# Patient Record
Sex: Male | Born: 1975 | Race: Black or African American | Hispanic: No | Smoking: Never smoker
Health system: Southern US, Community
[De-identification: ages and names within clinical notes are randomized; demographics above are authoritative.]

## PROBLEM LIST (undated history)

## (undated) DIAGNOSIS — F32A Depression, unspecified: Secondary | ICD-10-CM

## (undated) DIAGNOSIS — K219 Gastro-esophageal reflux disease without esophagitis: Secondary | ICD-10-CM

## (undated) DIAGNOSIS — I10 Essential (primary) hypertension: Secondary | ICD-10-CM

## (undated) HISTORY — DX: Gastro-esophageal reflux disease without esophagitis: K21.9

## (undated) HISTORY — DX: Essential (primary) hypertension: I10

## (undated) HISTORY — DX: Depression, unspecified: F32.A

---

## 2017-06-16 DIAGNOSIS — Z139 Encounter for screening, unspecified: Secondary | ICD-10-CM

## 2017-06-16 LAB — GLUCOSE, POCT (MANUAL RESULT ENTRY): POC Glucose: 99 mg/dl (ref 70–99)

## 2017-06-21 NOTE — Congregational Nurse Program (Signed)
Congregational Nurse Program Note  Date of Encounter: 06/16/2017  Past Medical History: No past medical history on file.  Encounter Details:     CNP Questionnaire - 06/16/17 1628      Patient Demographics   Is this a new or existing patient? New   Patient is considered a/an Not Applicable   Race Bi-Racial/Multi-Racial     Patient Assistance   Location of Patient Assistance [facility]   Patient's financial/insurance status Low Income;Self-Pay (Uninsured)   Uninsured Patient ([place] Card/Care Connects) Yes   Interventions Counseled to make appt. with provider;Assisted patient in making appt.   Patient referred to apply for the following financial assistance Not Applicable   Food insecurities addressed Not Applicable   Transportation assistance No   Assistance securing medications No   Educational health offerings Behavioral health;Chronic disease;Navigating the healthcare system     Encounter Details   Primary purpose of visit Other   Was an Emergency Department visit averted? Not Applicable   Does patient have a medical provider? Yes  but wants to transfer   Patient referred to Doctor referral for a non-emergent behavioral health crisis   Was a mental health screening completed? (GAINS tool) Yes   Was a mental health referral made? Yes   Does patient have dental issues? No   Does patient have vision issues? No   Does your patient have an abnormal blood pressure today? Yes   Since previous encounter, have you referred patient for abnormal blood pressure that resulted in a new diagnosis or medication change? No   Does your patient have an abnormal blood glucose today? No   Since previous encounter, have you referred patient for abnormal blood glucose that resulted in a new diagnosis or medication change? No   Was there a life-saving intervention made? No     Client was referred to Child psychotherapistsocial worker from [facility], Norval GablePatricia Gilley, at [facility] for a  behavioral health assessment. Client answered questions appropriately. Client appeared nervous throughout the session.  Social worker administered the GAIN-SS with client and the client scored a 7 for the past 90 days. Client denied both suicidal and homicidal ideations. Client denied both auditory and visual hallucinations and any current substance use. Client stated that the last time he thought about killing himself was over a year ago when his mother passed away. He denied having a plan at the time and his anchor was his fiancee.   Client reports feeling depressed often and anxious around others. Client stated he is quick to anger. His fiancee is his primary source of emotional support.    Social worker discussed [place]. Client decided he would like to go [facility] during their walk-in hours. Social worker will follow up with client to ensure his connection to services.   Irwin BrakemanAmber Maham Quintin, MSW, [phone]

## 2017-06-21 NOTE — Congregational Nurse Program (Signed)
Congregational Nurse Program Note  Date of Encounter: 06/16/2017  Past Medical History: No past medical history on file.  Encounter Details:     CNP Questionnaire - 06/16/17 1550      Patient Demographics   Is this a new or existing patient? New   Patient is considered a/an Not Applicable   Race Bi-Racial/Multi-Racial     Patient Assistance   Location of Patient Assistance [facility]   Patient's financial/insurance status Low Income;Self-Pay (Uninsured)   Uninsured Patient ([place] Card/Care Connects) Yes   Interventions Counseled to make appt. with provider;Assisted patient in making appt.   Patient referred to apply for the following financial assistance Not Applicable   Food insecurities addressed Not Applicable   Transportation assistance No   Assistance securing medications No   Educational health offerings Behavioral health;Chronic disease;Navigating the healthcare system     Encounter Details   Primary purpose of visit Chronic Illness/Condition Visit   Was an Emergency Department visit averted? Not Applicable   Does patient have a medical provider? Yes  but wants to transfer   Patient referred to Area Agency;Clinic;Other;Establish PCP   Was a mental health screening completed? (GAINS tool) Yes   Was a mental health referral made? Yes   Does patient have dental issues? No   Does patient have vision issues? No   Does your patient have an abnormal blood pressure today? Yes   Since previous encounter, have you referred patient for abnormal blood pressure that resulted in a new diagnosis or medication change? No   Does your patient have an abnormal blood glucose today? No   Since previous encounter, have you referred patient for abnormal blood glucose that resulted in a new diagnosis or medication change? No   Was there a life-saving intervention made? No     New client to [facility]. Client lives with his girlfriend and both are applying for disability. 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 Client has previously been seen at [facility] and treated for hypertension. Client states he wants to transfer stating he doesn't feel they addressed his back pain while there.  No Past surgical history  Medical History Hypertension  No Known drug allergies  Current medications per client: Lisinopril 20 mg one tablet orally once a day. ( client reports taking it at night )  Alert and oriented to person place and time. Answers questions appropriately. Does not appear anxious. Last seen per client at [facility] was 6 months ago. States they told him they could "only see me for my blood pressure". Client reports long history of lower back pain in the lower thoracic to upper lumbar region, it only radiates to the sides. He does not report any numbness of his legs or weakness, gait normal. Complains that it hurts to stand or sit and the pain with standing at his last job was too painful. Client reports depression and anxiety around people. He also reports that "I have anger issues". Client asked if seeking counseling would be helpful and he is agreeable. Referral made to [employer] MSW while client is here today. Client also complains of constipation and RN discussed increasing fiber and water into diet. Client states he gets vegetables and fruits in smoothies and we discussed eating fresh raw fruits along with vegetables and increasing water intake could help with constipation. Discussed in depth the options for primary medical care . Client still wishes to transfer to [facility] for primary medical  care. Discussed that the medical provider would determine any treatment and diagnostics if needed to evaluate back pain. Client states understanding.  Referral made and appointment secured for 06/22/17 at 10:15 am.  Plan: [facility] Ref[facility] MS[facility] for risk assessment and case management for  further needs. Referral to [facility] Health services [facility] walk in intake for Monday 06/21/17 at 0900  Will follow up as needed.

## 2017-06-22 ENCOUNTER — Ambulatory Visit: Payer: Self-pay | Admitting: Physician Assistant

## 2017-06-22 ENCOUNTER — Encounter: Payer: Self-pay | Admitting: Physician Assistant

## 2017-06-22 VITALS — BP 140/90 | HR 97 | Temp 97.7°F | Ht 68.0 in | Wt 238.5 lb

## 2017-06-22 DIAGNOSIS — Z131 Encounter for screening for diabetes mellitus: Secondary | ICD-10-CM

## 2017-06-22 DIAGNOSIS — I10 Essential (primary) hypertension: Secondary | ICD-10-CM

## 2017-06-22 DIAGNOSIS — G8929 Other chronic pain: Secondary | ICD-10-CM

## 2017-06-22 DIAGNOSIS — M545 Low back pain, unspecified: Secondary | ICD-10-CM

## 2017-06-22 DIAGNOSIS — Z13220 Encounter for screening for lipoid disorders: Secondary | ICD-10-CM

## 2017-06-22 MED ORDER — LISINOPRIL 20 MG PO TABS: 20.0000 mg | ORAL_TABLET | Freq: Every day | ORAL | 1 refills | Status: DC

## 2017-06-22 NOTE — Progress Notes (Signed)
BP 140/90 (BP Location: Left Arm, Patient Position: Sitting, Cuff Size: Large)   Pulse 97   Temp 97.7 F (36.5 C)   Ht 5\' 8"  (1.727 m)   Wt 238 lb 8 oz (108.2 kg)   SpO2 97%   BMI 36.26 kg/m    Subjective:    Patient ID: Paul BanksWayne Epler, male    DOB: 05/13/1976, 41 y.o.   MRN: [id number]  HPI: Paul Kidd is a 41 y.o. male presenting on 06/22/2017 for New Patient (Initial Visit) (last seen a PCP was summer 2017 pt is unsure where) and Gastroesophageal Reflux   HPI   Pt was [facility]. He thinks it was last summer when last he was seen there.   Pt states he wants help with back pain.  He says it started hurted hurting when he was 17.  He says no injury but he thinks it might have been because he lifted heavy weights.   He says it got worse when he worked at [employer] lifting boxes.  He is not working now.  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  He says he is trying to get disability for "all his medical problems".   Pt played ball in high school but his only injury was a dislocated knee while playing basketball.  He denies football injury.  Relevant past medical, surgical, family and social history reviewed and updated as indicated. Interim medical history since our last visit reviewed. Allergies and medications reviewed and updated.   Current Outpatient Prescriptions:  .  lisinopril (PRINIVIL,ZESTRIL) 20 MG tablet, Take 20 mg by mouth daily., Disp: , Rfl:    Review of Systems  Constitutional: Positive for appetite change, chills and fatigue. Negative for diaphoresis, fever and unexpected weight change.  HENT: Positive for congestion and sneezing. Negative for dental problem, drooling, ear pain, facial swelling, hearing loss, mouth sores, sore throat, trouble swallowing and voice change.   Eyes: Negative for pain, discharge, redness, itching and visual disturbance.  Respiratory: Negative for cough, choking, shortness of breath and wheezing.    Cardiovascular: Negative for chest pain, palpitations and leg swelling.  Gastrointestinal: Negative for abdominal pain, blood in stool, constipation, diarrhea and vomiting.  Endocrine: Negative for cold intolerance, heat intolerance and polydipsia.  Genitourinary: Negative for decreased urine volume, dysuria and hematuria.  Musculoskeletal: Positive for back pain and gait problem. Negative for arthralgias.  Skin: Negative for rash.  Allergic/Immunologic: Positive for environmental allergies.  Neurological: Positive for headaches. Negative for seizures, syncope and light-headedness.  Hematological: Negative for adenopathy.  Psychiatric/Behavioral: Positive for dysphoric mood. Negative for agitation and suicidal ideas. The patient is nervous/anxious.     Per HPI unless specifically indicated above     Objective:    BP 140/90 (BP Location: Left Arm, Patient Position: Sitting, Cuff Size: Large)   Pulse 97   Temp 97.7 F (36.5 C)   Ht 5\' 8"  (1.727 m)   Wt 238 lb 8 oz (108.2 kg)   SpO2 97%   BMI 36.26 kg/m   Wt Readings from Last 3 Encounters:  06/22/17 238 lb 8 oz (108.2 kg)  06/16/17 239 lb 6.4 oz (108.6 kg)    Physical Exam  Constitutional: He is oriented to person, place, and time. He appears well-developed and well-nourished.  HENT:  Head: Normocephalic and atraumatic.  Right Ear: A foreign body is present.  Left Ear: A foreign body is present.  Mouth/Throat: Oropharynx is clear and moist.  No oropharyngeal exudate.  Cerumen B ears  Eyes: Conjunctivae and EOM are normal. Pupils are equal, round, and reactive to light.  Neck: Neck supple. No thyromegaly present.  Cardiovascular: Normal rate and regular rhythm.   Pulmonary/Chest: Effort normal and breath sounds normal. He has no wheezes. He has no rales.  Abdominal: Soft. Bowel sounds are normal. He exhibits no mass. There is no hepatosplenomegaly. There is no tenderness.  Musculoskeletal: He exhibits no edema.   Lymphadenopathy:    He has no cervical adenopathy.  Neurological: He is alert and oriented to person, place, and time.  Skin: Skin is warm and dry. No rash noted.  Psychiatric: He has a normal mood and affect. His behavior is normal. Thought content normal.  Vitals reviewed.         Assessment & Plan:   Encounter Diagnoses  Name Primary?  . Essential hypertension Yes  . Chronic low back pain without sciatica, unspecified back pain laterality   . Screening cholesterol level   . Screening for diabetes mellitus      -will continue current lisinopril 20.  Will reevaluate bp at f/u OV and adjust if bp still high -will get baseline labs -will get xray LS spine -pt is given [facility] discount application -follow OV 2 wk.  RTO sooner prn

## 2017-06-22 NOTE — Patient Instructions (Signed)
[address], [place], [place] [id number]

## 2017-07-05 ENCOUNTER — Encounter: Payer: Self-pay | Admitting: Physician Assistant

## 2017-07-05 ENCOUNTER — Ambulatory Visit: Payer: Self-pay | Admitting: Physician Assistant

## 2017-07-05 VITALS — BP 144/90 | HR 67 | Temp 98.4°F | Ht 68.0 in | Wt 247.5 lb

## 2017-07-05 DIAGNOSIS — I10 Essential (primary) hypertension: Secondary | ICD-10-CM | POA: Insufficient documentation

## 2017-07-05 DIAGNOSIS — G8929 Other chronic pain: Secondary | ICD-10-CM

## 2017-07-05 DIAGNOSIS — M545 Low back pain: Secondary | ICD-10-CM

## 2017-07-05 MED ORDER — LISINOPRIL-HYDROCHLOROTHIAZIDE 20-12.5 MG PO TABS: 1.0000 | ORAL_TABLET | Freq: Every day | ORAL | 1 refills | Status: DC

## 2017-07-05 NOTE — Progress Notes (Signed)
   BP (!) 144/90 (BP Location: Right Arm, Patient Position: Sitting, Cuff Size: Large)   Pulse 67   Temp 98.4 F (36.9 C)   Ht 5\' 8"  (1.727 m)   Wt 247 lb 8 oz (112.3 kg)   SpO2 97%   BMI 37.63 kg/m    Subjective:    Patient ID: Paul Kidd, male    DOB: 05-Nov-1976, 41 y.o.   MRN: [id number]  HPI: Paul BanksWayne Kidd is a 41 y.o. male presenting on 07/05/2017 for Hypertension and Back Pain   HPI   Pt didn't get blood drawn. Pt says he did turn in his [employer] discount application Pt didn't get his xray done.    Relevant past medical, surgical, family and social history reviewed and updated as indicated. Interim medical history since our last visit reviewed. Allergies and medications reviewed and updated.   Current Outpatient Prescriptions:  .  lisinopril (PRINIVIL,ZESTRIL) 20 MG tablet, Take 1 tablet (20 mg total) by mouth daily., Disp: 30 tablet, Rfl: 1   Review of Systems  Eyes: Negative for visual disturbance.  Respiratory: Negative for shortness of breath.   Cardiovascular: Negative for chest pain.  Neurological: Negative for headaches.    Per HPI unless specifically indicated above     Objective:    BP (!) 144/90 (BP Location: Right Arm, Patient Position: Sitting, Cuff Size: Large)   Pulse 67   Temp 98.4 F (36.9 C)   Ht 5\' 8"  (1.727 m)   Wt 247 lb 8 oz (112.3 kg)   SpO2 97%   BMI 37.63 kg/m   Wt Readings from Last 3 Encounters:  07/05/17 247 lb 8 oz (112.3 kg)  06/22/17 238 lb 8 oz (108.2 kg)  06/16/17 239 lb 6.4 oz (108.6 kg)    Physical Exam  Constitutional: He is oriented to person, place, and time. He appears well-developed and well-nourished.  HENT:  Head: Normocephalic and atraumatic.  Neck: Neck supple.  Cardiovascular: Normal rate and regular rhythm.   Pulmonary/Chest: Effort normal and breath sounds normal. No respiratory distress. He has no wheezes.  Musculoskeletal: He exhibits no edema.  Neurological: He is alert and oriented to person, place,  and time.  Skin: Skin is warm and dry.  Psychiatric: He has a normal mood and affect. His behavior is normal.  Nursing note and vitals reviewed.       Assessment & Plan:    Encounter Diagnoses  Name Primary?  . Essential hypertension Yes  . Chronic low back pain without sciatica, unspecified back pain laterality     -Increase bp med -pt counseled to Get fasting labs/xray -follow up 3-4 weeks. RTO sooner prn

## 2017-07-05 NOTE — Patient Instructions (Signed)
GET BLOOD DRAWN  GET X-RAY DONE

## 2017-07-07 ENCOUNTER — Other Ambulatory Visit (HOSPITAL_COMMUNITY)
Admission: RE | Admit: 2017-07-07 | Discharge: 2017-07-07 | Disposition: A | Discharge status: Home / Self Care | Payer: Self-pay | Source: Ambulatory Visit | Attending: Physician Assistant | Admitting: Physician Assistant

## 2017-07-07 ENCOUNTER — Ambulatory Visit (HOSPITAL_COMMUNITY)
Admission: RE | Admit: 2017-07-07 | Discharge: 2017-07-07 | Disposition: A | Discharge status: Home / Self Care | Payer: Self-pay | Source: Ambulatory Visit | Attending: Physician Assistant | Admitting: Physician Assistant

## 2017-07-07 DIAGNOSIS — Z13220 Encounter for screening for lipoid disorders: Secondary | ICD-10-CM | POA: Insufficient documentation

## 2017-07-07 DIAGNOSIS — G8929 Other chronic pain: Secondary | ICD-10-CM | POA: Insufficient documentation

## 2017-07-07 DIAGNOSIS — M545 Low back pain, unspecified: Secondary | ICD-10-CM

## 2017-07-07 DIAGNOSIS — I10 Essential (primary) hypertension: Secondary | ICD-10-CM | POA: Insufficient documentation

## 2017-07-07 DIAGNOSIS — Z131 Encounter for screening for diabetes mellitus: Secondary | ICD-10-CM | POA: Insufficient documentation

## 2017-07-07 LAB — CBC
HEMATOCRIT: 39.3 % (ref 39.0–52.0)
HEMOGLOBIN: 13.1 g/dL (ref 13.0–17.0)
MCH: 28.7 pg (ref 26.0–34.0)
MCHC: 33.3 g/dL (ref 30.0–36.0)
MCV: 86.2 fL (ref 78.0–100.0)
Platelets: 234 10*3/uL (ref 150–400)
RBC: 4.56 MIL/uL (ref 4.22–5.81)
RDW: 12.6 % (ref 11.5–15.5)
WBC: 6 10*3/uL (ref 4.0–10.5)

## 2017-07-07 LAB — COMPREHENSIVE METABOLIC PANEL
ALBUMIN: 4.5 g/dL (ref 3.5–5.0)
ALT: 15 U/L — AB (ref 17–63)
AST: 19 U/L (ref 15–41)
Alkaline Phosphatase: 45 U/L (ref 38–126)
Anion gap: 8 (ref 5–15)
BUN: 17 mg/dL (ref 6–20)
CHLORIDE: 101 mmol/L (ref 101–111)
CO2: 29 mmol/L (ref 22–32)
CREATININE: 1.22 mg/dL (ref 0.61–1.24)
Calcium: 9.6 mg/dL (ref 8.9–10.3)
GFR calc Af Amer: >60 mL/min (ref >60)
Glucose, Bld: 120 mg/dL — ABNORMAL HIGH (ref 65–99)
POTASSIUM: 3.8 mmol/L (ref 3.5–5.1)
Sodium: 138 mmol/L (ref 135–145)
Total Bilirubin: 1.2 mg/dL (ref 0.3–1.2)
Total Protein: 7.6 g/dL (ref 6.5–8.1)

## 2017-07-07 LAB — LIPID PANEL
Cholesterol: 197 mg/dL (ref 0–200)
HDL: 49 mg/dL (ref >40)
LDL CALC: 124 mg/dL — AB (ref 0–99)
Total CHOL/HDL Ratio: 4 RATIO
Triglycerides: 118 mg/dL (ref <150)
VLDL: 24 mg/dL (ref 0–40)

## 2017-07-08 LAB — HEMOGLOBIN A1C
Hgb A1c MFr Bld: 5.3 % (ref 4.8–5.6)
Mean Plasma Glucose: 105 mg/dL

## 2017-07-26 ENCOUNTER — Ambulatory Visit: Payer: Self-pay | Admitting: Physician Assistant

## 2017-07-26 ENCOUNTER — Encounter: Payer: Self-pay | Admitting: Physician Assistant

## 2017-07-26 VITALS — BP 130/90 | HR 69 | Ht 68.0 in | Wt 243.5 lb

## 2017-07-26 DIAGNOSIS — M545 Low back pain, unspecified: Secondary | ICD-10-CM

## 2017-07-26 DIAGNOSIS — E785 Hyperlipidemia, unspecified: Secondary | ICD-10-CM

## 2017-07-26 DIAGNOSIS — I10 Essential (primary) hypertension: Secondary | ICD-10-CM

## 2017-07-26 DIAGNOSIS — G8929 Other chronic pain: Secondary | ICD-10-CM | POA: Insufficient documentation

## 2017-07-26 MED ORDER — NAPROXEN 500 MG PO TABS: 500.0000 mg | ORAL_TABLET | Freq: Two times a day (BID) | ORAL | 0 refills | Status: DC

## 2017-07-26 MED ORDER — ATENOLOL 25 MG PO TABS: 25.0000 mg | ORAL_TABLET | Freq: Every day | ORAL | 1 refills | Status: DC

## 2017-07-26 NOTE — Patient Instructions (Addendum)
Financial Counselor- [phone]     Fat and Cholesterol Restricted Diet High levels of fat and cholesterol in your blood may lead to various health problems, such as diseases of the heart, blood vessels, gallbladder, liver, and pancreas. Fats are concentrated sources of energy that come in various forms. Certain types of fat, including saturated fat, may be harmful in excess. Cholesterol is a substance needed by your body in small amounts. Your body makes all the cholesterol it needs. Excess cholesterol comes from the food you eat. When you have high levels of cholesterol and saturated fat in your blood, health problems can develop because the excess fat and cholesterol will gather along the walls of your blood vessels, causing them to narrow. Choosing the right foods will help you control your intake of fat and cholesterol. This will help keep the levels of these substances in your blood within normal limits and reduce your risk of disease. What is my plan? Your health care provider recommends that you:  Limit your fat intake to ______% or less of your total calories per day.  Limit the amount of cholesterol in your diet to less than _________mg per day.  Eat 20-30 grams of fiber each day.  What types of fat should I choose?  Choose healthy fats more often. Choose monounsaturated and polyunsaturated fats, such as olive and canola oil, flaxseeds, walnuts, almonds, and seeds.  Eat more omega-3 fats. Good choices include salmon, mackerel, sardines, tuna, flaxseed oil, and ground flaxseeds. Aim to eat fish at least two times a week.  Limit saturated fats. Saturated fats are primarily found in animal products, such as meats, butter, and cream. Plant sources of saturated fats include palm oil, palm kernel oil, and coconut oil.  Avoid foods with partially hydrogenated oils in them. These contain trans fats. Examples of foods that contain trans fats are stick margarine, some tub margarines,  cookies, crackers, and other baked goods. What general guidelines do I need to follow? These guidelines for healthy eating will help you control your intake of fat and cholesterol:  Check food labels carefully to identify foods with trans fats or high amounts of saturated fat.  Fill one half of your plate with vegetables and green salads.  Fill one fourth of your plate with whole grains. Look for the word "whole" as the first word in the ingredient list.  Fill one fourth of your plate with lean protein foods.  Limit fruit to two servings a day. Choose fruit instead of juice.  Eat more foods that contain fiber, such as apples, broccoli, carrots, beans, peas, and barley.  Eat more home-cooked food and less restaurant, buffet, and fast food.  Limit or avoid alcohol.  Limit foods high in starch and sugar.  Limit fried foods.  Cook foods using methods other than frying. Baking, boiling, grilling, and broiling are all great options.  Lose weight if you are overweight. Losing just 5-10% of your initial body weight can help your overall health and prevent diseases such as diabetes and heart disease.  What foods can I eat? Grains  Whole grains, such as whole wheat or whole grain breads, crackers, cereals, and pasta. Unsweetened oatmeal, bulgur, barley, quinoa, or brown rice. Corn or whole wheat flour tortillas. Vegetables  Fresh or frozen vegetables (raw, steamed, roasted, or grilled). Green salads. Fruits  All fresh, canned (in natural juice), or frozen fruits. Meats and other protein foods  Ground beef (85% or leaner), grass-fed beef, or beef trimmed of fat. Skinless  chicken or [place]. Ground chicken or [place]. Pork trimmed of fat. All fish and seafood. Eggs. Dried beans, peas, or lentils. Unsalted nuts or seeds. Unsalted canned or dry beans. Dairy  Low-fat dairy products, such as skim or 1% milk, 2% or reduced-fat cheeses, low-fat ricotta or cottage cheese, or plain low-fat  yo Fats and oils  Tub margarines without trans fats. Light or reduced-fat mayonnaise and salad dressings. Avocado. Olive, canola, sesame, or safflower oils. Natural peanut or almond butter (choose ones without added sugar and oil). The items listed above may not be a complete list of recommended foods or beverages. Contact your dietitian for more options. Foods to avoid Grains  White bread. White pasta. White rice. Cornbread. Bagels, pastries, and croissants. Crackers that contain trans fat. Vegetables  White potatoes. Corn. Creamed or fried vegetables. Vegetables in a cheese sauce. Fruits  Dried fruits. Canned fruit in light or heavy syrup. Fruit juice. Meats and other protein foods  Fatty cuts of meat. Ribs, chicken wings, bacon, sausage, bologna, salami, chitterlings, fatback, hot dogs, bratwurst, and packaged luncheon meats. Liver and organ meats. Dairy  Whole or 2% milk, cream, half-and-half, and cream cheese. Whole milk cheeses. Whole-fat or sweetened yogurt. Full-fat cheeses. Nondairy creamers and whipped toppings. Processed cheese, cheese spreads, or cheese curds. Beverages  Alcohol. Sweetened drinks (such as sodas, lemonade, and fruit drinks or punches). Fats and oils  Butter, stick margarine, lard, shortening, ghee, or bacon fat. Coconut, palm kernel, or palm oils. Sweets and desserts  Corn syrup, sugars, honey, and molasses. Candy. Jam and jelly. Syrup. Sweetened cereals. Cookies, pies, cakes, donuts, muffins, and ice cream. The items listed above may not be a complete list of foods and beverages to avoid. Contact your dietitian for more information. This information is not intended to replace advice given to you by your health care provider. Make sure you discuss any questions you have with your health care provider. Document Released: 12/07/2005 Document Revised: 12/28/2014 Document Reviewed: 03/07/2014 Elsevier Interactive Patient Education  2017 [employer]

## 2017-07-26 NOTE — Progress Notes (Signed)
BP 130/90 (BP Location: Left Arm, Patient Position: Sitting, Cuff Size: Large)   Pulse 69   Ht 5\' 8"  (1.727 m)   Wt 243 lb 8 oz (110.5 kg)   SpO2 97%   BMI 37.02 kg/m    Subjective:    Patient ID: Paul Kidd, male    DOB: 05/15/1976, 41 y.o.   MRN: [id number]  HPI: Paul Kidd is a 41 y.o. male presenting on 07/26/2017 for Hypertension and Back Pain   HPI   Pt states he wants help with back pain.  He says it started hurted hurting when he was 17.  He says no injury but he thinks it might have been because he lifted heavy weights.   He says it got worse when he worked at [employer] lifting boxes.  He is not working now.  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   Pt played ball in high school but his only injury was a dislocated knee while playing basketball.  He denies football injury.  Pt states back pain comes and goes.  He says it hurts to sit and then it hurts to stand up.  He says it hurts either way.  He says it also hurts when he is laying down.  He says sometimes he has to get out of bed and walk around for a few minutes due to his back pain.  He says his pain is in the middle back.  He says his back pain radiates into his L arm.   He says some days it hurts all day, other days he doesn't feel much of it.   He says heat does not improve his pain.  He says he has never tried tylenol for the back pain and he says he "would never touch ibuprofen".  He says "it's a bad medicine" and he "doesn't touch that".     Relevant past medical, surgical, family and social history reviewed and updated as indicated. Interim medical history since our last visit reviewed. Allergies and medications reviewed and updated.  CURRENT MEDS Lisinopril-hctz 20/12.5  Review of Systems  Constitutional: Positive for fatigue. Negative for appetite change, chills, diaphoresis, fever and unexpected weight change.  HENT: Positive for congestion, sneezing and sore throat.  Negative for dental problem, drooling, ear pain, facial swelling, hearing loss, mouth sores, trouble swallowing and voice change.   Eyes: Negative for pain, discharge, redness, itching and visual disturbance.  Respiratory: Negative for cough, choking, shortness of breath and wheezing.   Cardiovascular: Negative for chest pain, palpitations and leg swelling.  Gastrointestinal: Positive for abdominal pain and constipation. Negative for blood in stool, diarrhea and vomiting.  Endocrine: Positive for cold intolerance, heat intolerance and polydipsia.  Genitourinary: Negative for decreased urine volume, dysuria and hematuria.  Musculoskeletal: Positive for back pain and gait problem. Negative for arthralgias.  Skin: Negative for rash.  Allergic/Immunologic: Positive for environmental allergies.  Neurological: Negative for seizures, syncope, light-headedness and headaches.  Hematological: Negative for adenopathy.  Psychiatric/Behavioral: Positive for dysphoric mood. Negative for agitation and suicidal ideas. The patient is nervous/anxious.     Per HPI unless specifically indicated above     Objective:    BP 130/90 (BP Location: Left Arm, Patient Position: Sitting, Cuff Size: Large)   Pulse 69   Ht 5\' 8"  (1.727 m)   Wt 243 lb 8 oz (110.5 kg)   SpO2 97%   BMI 37.02 kg/m   Wt Readings from Last 3 Encounters:  07/26/17 243 lb 8 oz (110.5 kg)  07/05/17 247 lb 8 oz (112.3 kg)  06/22/17 238 lb 8 oz (108.2 kg)    Physical Exam  Constitutional: He is oriented to person, place, and time. He appears well-developed and well-nourished.  HENT:  Head: Normocephalic and atraumatic.  Neck: Neck supple.  Cardiovascular: Normal rate and regular rhythm.   Pulmonary/Chest: Effort normal and breath sounds normal. He has no wheezes.  Abdominal: Soft. Bowel sounds are normal. He exhibits no pulsatile midline mass. There is no hepatosplenomegaly. There is no tenderness.  Musculoskeletal: He exhibits no  edema.       Thoracic back: He exhibits tenderness. He exhibits normal range of motion, no bony tenderness, no swelling, no edema, no deformity and no spasm.       Lumbar back: He exhibits tenderness. He exhibits normal range of motion, no bony tenderness, no swelling, no edema and no deformity.       Back:  Very mild soft tissue tenderness lower thoracic and upper lumbar back.  There is no point tenderness, bony tenderness.   No weakness.  SLR negative.   Lymphadenopathy:    He has no cervical adenopathy.  Neurological: He is alert and oriented to person, place, and time. He has normal strength. He displays no tremor. Coordination and gait normal.  Reflex Scores:      Patellar reflexes are 2+ on the right side and 2+ on the left side. Skin: Skin is warm and dry.  Psychiatric: He has a normal mood and affect. His behavior is normal.  Vitals reviewed.   Results for orders placed or performed during the hospital encounter of 07/07/17  Comprehensive metabolic panel  Result Value Ref Range   Sodium 138 135 - 145 mmol/L   Potassium 3.8 3.5 - 5.1 mmol/L   Chloride 101 101 - 111 mmol/L   CO2 29 22 - 32 mmol/L   Glucose, Bld 120 (H) 65 - 99 mg/dL   BUN 17 6 - 20 mg/dL   Creatinine, Ser 1.61 0.61 - 1.24 mg/dL   Calcium 9.6 8.9 - 09.6 mg/dL   Total Protein 7.6 6.5 - 8.1 g/dL   Albumin 4.5 3.5 - 5.0 g/dL   AST 19 15 - 41 U/L   ALT 15 (L) 17 - 63 U/L   Alkaline Phosphatase 45 38 - 126 U/L   Total Bilirubin 1.2 0.3 - 1.2 mg/dL   GFR calc non Af Amer >60 >60 mL/min   GFR calc Af Amer >60 >60 mL/min   Anion gap 8 5 - 15  Lipid panel  Result Value Ref Range   Cholesterol 197 0 - 200 mg/dL   Triglycerides 045 <409 mg/dL   HDL 49 >81 mg/dL   Total CHOL/HDL Ratio 4.0 RATIO   VLDL 24 0 - 40 mg/dL   LDL Cholesterol 191 (H) 0 - 99 mg/dL  CBC  Result Value Ref Range   WBC 6.0 4.0 - 10.5 K/uL   RBC 4.56 4.22 - 5.81 MIL/uL   Hemoglobin 13.1 13.0 - 17.0 g/dL   HCT 47.8 29.5 - 62.1 %   MCV  86.2 78.0 - 100.0 fL   MCH 28.7 26.0 - 34.0 pg   MCHC 33.3 30.0 - 36.0 g/dL   RDW 30.8 65.7 - 84.6 %   Platelets 234 150 - 400 K/uL  Hemoglobin A1c  Result Value Ref Range   Hgb A1c MFr Bld 5.3 4.8 - 5.6 %   Mean Plasma Glucose 105 mg/dL  Assessment & Plan:    Encounter Diagnoses  Name Primary?  . Essential hypertension Yes  . Chronic low back pain without sciatica, unspecified back pain laterality   . Hyperlipidemia, unspecified hyperlipidemia type     -Reviewed labs and back xray with pt -counseled pt on Low-fat diet and gave handout and encouraged regular exercise for lipids -will Add atenolol 25.  Continue lisinopril-hctz.  Discussed with pt need to control his bp in specific due to Cr at upper limit of normal range and he is still very young -discussed referral to orthopedics and possibly physical therapy.  Pt has not hear on his [employer] discount yet -pt to check on cone discount application -trial of naproxen bid.  Counseled pt to take with food.  Discussed with pt that GI bleeding with 1 month of naproxen unlikely.  Pt says he is willing to give it a try -pt to follow up in 4 weeks to recheck bp and back pain. Pt to RTO sooner prn  (The duration of this appointment visit was 25 minutes of face-to-face time with the patient.  Greater than 50% of this time was spent in counseling, explanation of diagnosis, planning of further management, and coordination of care.)

## 2017-08-25 ENCOUNTER — Encounter: Payer: Self-pay | Admitting: Physician Assistant

## 2017-08-25 ENCOUNTER — Ambulatory Visit: Payer: Self-pay | Admitting: Physician Assistant

## 2017-08-25 VITALS — BP 144/96 | HR 60 | Temp 98.4°F | Ht 68.0 in | Wt 241.5 lb

## 2017-08-25 DIAGNOSIS — M545 Low back pain, unspecified: Secondary | ICD-10-CM

## 2017-08-25 DIAGNOSIS — E785 Hyperlipidemia, unspecified: Secondary | ICD-10-CM

## 2017-08-25 DIAGNOSIS — I10 Essential (primary) hypertension: Secondary | ICD-10-CM

## 2017-08-25 DIAGNOSIS — Z5320 Procedure and treatment not carried out because of patient's decision for unspecified reasons: Secondary | ICD-10-CM

## 2017-08-25 DIAGNOSIS — G8929 Other chronic pain: Secondary | ICD-10-CM

## 2017-08-25 DIAGNOSIS — Z538 Procedure and treatment not carried out for other reasons: Secondary | ICD-10-CM

## 2017-08-25 NOTE — Progress Notes (Signed)
BP (!) 144/96 (BP Location: Left Arm, Patient Position: Sitting, Cuff Size: Large)   Pulse 60   Temp 98.4 F (36.9 C)   Ht 5\' 8"  (1.727 m)   Wt 241 lb 8 oz (109.5 kg)   SpO2 99%   BMI 36.72 kg/m    Subjective:    Patient ID: Paul Kidd, male    DOB: October 29, 1976, 41 y.o.   MRN: [id number]  HPI: Paul Kidd is a 41 y.o. male presenting on 08/25/2017 for Hypertension and Back Pain (pt states naproxen does not work at all)   HPI  Pt states that bp at home runs 128-135 on the top.  He says he doesn't like coming in to the office. It makes his bp run high.   Pt did not call and check on his [employer] discount application.   Pt says the naproxen didn't help at all.  Pt difficult with offered medications options at previous OV.  Discussed with pt that physical therapy often helps in these circumstance and Pt refuses physical therapy.  I asked him why and he says that PT is not what he wants, he just wants disability.  I asked did he not want to get better and feel better and he said no, he just wanted disability.    Relevant past medical, surgical, family and social history reviewed and updated as indicated. Interim medical history since our last visit reviewed. Allergies and medications reviewed and updated.   Current Outpatient Prescriptions:  .  atenolol (TENORMIN) 25 MG tablet, Take 1 tablet (25 mg total) by mouth daily., Disp: 30 tablet, Rfl: 1 .  lisinopril-hydrochlorothiazide (ZESTORETIC) 20-12.5 MG tablet, Take 1 tablet by mouth daily., Disp: 30 tablet, Rfl: 1 .  naproxen (NAPROSYN) 500 MG tablet, Take 1 tablet (500 mg total) by mouth 2 (two) times daily with a meal., Disp: 60 tablet, Rfl: 0   Review of Systems  Constitutional: Negative for appetite change, chills, diaphoresis, fatigue, fever and unexpected weight change.  HENT: Negative for congestion, dental problem, drooling, ear pain, facial swelling, hearing loss, mouth sores, sneezing, sore throat, trouble swallowing and  voice change.   Eyes: Negative for pain, discharge, redness, itching and visual disturbance.  Respiratory: Negative for cough, choking, shortness of breath and wheezing.   Cardiovascular: Negative for chest pain, palpitations and leg swelling.  Gastrointestinal: Negative for abdominal pain, blood in stool, constipation, diarrhea and vomiting.  Endocrine: Negative for cold intolerance, heat intolerance and polydipsia.  Genitourinary: Negative for decreased urine volume, dysuria and hematuria.  Musculoskeletal: Positive for back pain. Negative for arthralgias and gait problem.  Skin: Negative for rash.  Allergic/Immunologic: Negative for environmental allergies.  Neurological: Negative for seizures, syncope, light-headedness and headaches.  Hematological: Negative for adenopathy.  Psychiatric/Behavioral: Positive for dysphoric mood. Negative for agitation and suicidal ideas. The patient is nervous/anxious.     Per HPI unless specifically indicated above     Objective:    BP (!) 144/96 (BP Location: Left Arm, Patient Position: Sitting, Cuff Size: Large)   Pulse 60   Temp 98.4 F (36.9 C)   Ht 5\' 8"  (1.727 m)   Wt 241 lb 8 oz (109.5 kg)   SpO2 99%   BMI 36.72 kg/m   Wt Readings from Last 3 Encounters:  08/25/17 241 lb 8 oz (109.5 kg)  07/26/17 243 lb 8 oz (110.5 kg)  07/05/17 247 lb 8 oz (112.3 kg)    Physical Exam  Constitutional: He is oriented to person, place, and time.  He appears well-developed and well-nourished.  HENT:  Head: Normocephalic and atraumatic.  Neck: Neck supple.  Cardiovascular: Normal rate and regular rhythm.   Pulmonary/Chest: Effort normal and breath sounds normal. He has no wheezes.  Abdominal: Soft. Bowel sounds are normal. There is no hepatosplenomegaly. There is no tenderness.  Musculoskeletal: He exhibits no edema.  Lymphadenopathy:    He has no cervical adenopathy.  Neurological: He is alert and oriented to person, place, and time.  Skin: Skin is  warm and dry.  Psychiatric: He has a normal mood and affect. His behavior is normal.  Vitals reviewed.        Assessment & Plan:   Encounter Diagnoses  Name Primary?  . Essential hypertension Yes  . Hyperlipidemia, unspecified hyperlipidemia type   . Refusal of rehabilitation services   . Chronic low back pain without sciatica, unspecified back pain laterality      -discussed with pt that we do not provide disability exams.  Also I do not feel that he needs disability.   Since he refuses all recommendedions to help his back, we would no longer treat his back  He says okay.  -told pt that we would be glad to continue to treat his other issues including his htn.  Pt is counseled to bring his bp meter with him to his next OV so we can check accuracy.   -no changes to medication today -follow up 2 months to recheck bp and check pt's bp machine.  Pt to RTO sooner prn

## 2017-10-25 ENCOUNTER — Ambulatory Visit: Payer: Self-pay | Admitting: Physician Assistant

## 2017-11-01 ENCOUNTER — Encounter: Payer: Self-pay | Admitting: Physician Assistant

## 2018-10-16 IMAGING — DX DG LUMBAR SPINE COMPLETE 4+V
5 series · 5 of 5 positions shown · non-contrast
Comparison: None.

CLINICAL DATA: Low back pain common no known injury, initial
encounter

EXAM:
LUMBAR SPINE - COMPLETE 4+ VIEW

[l-spine ap]
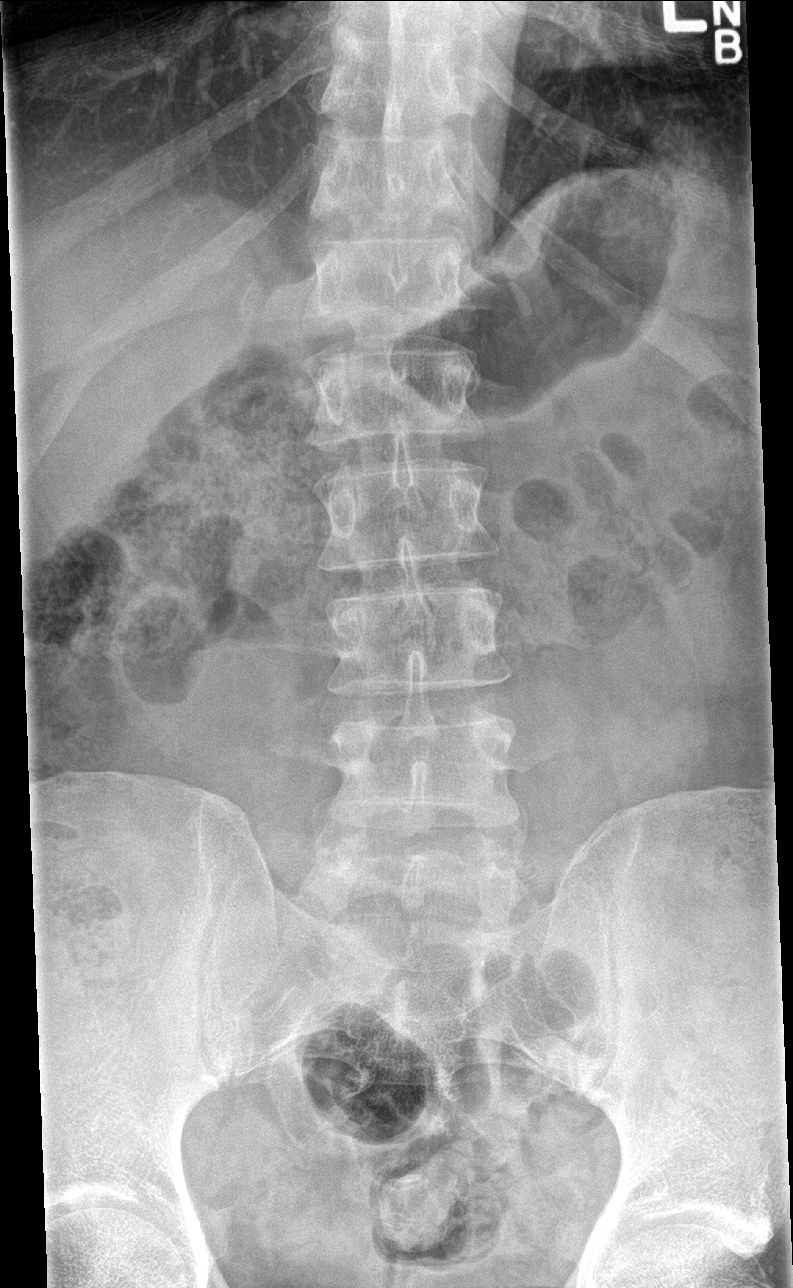

[l-spine obl (1 of 2)]
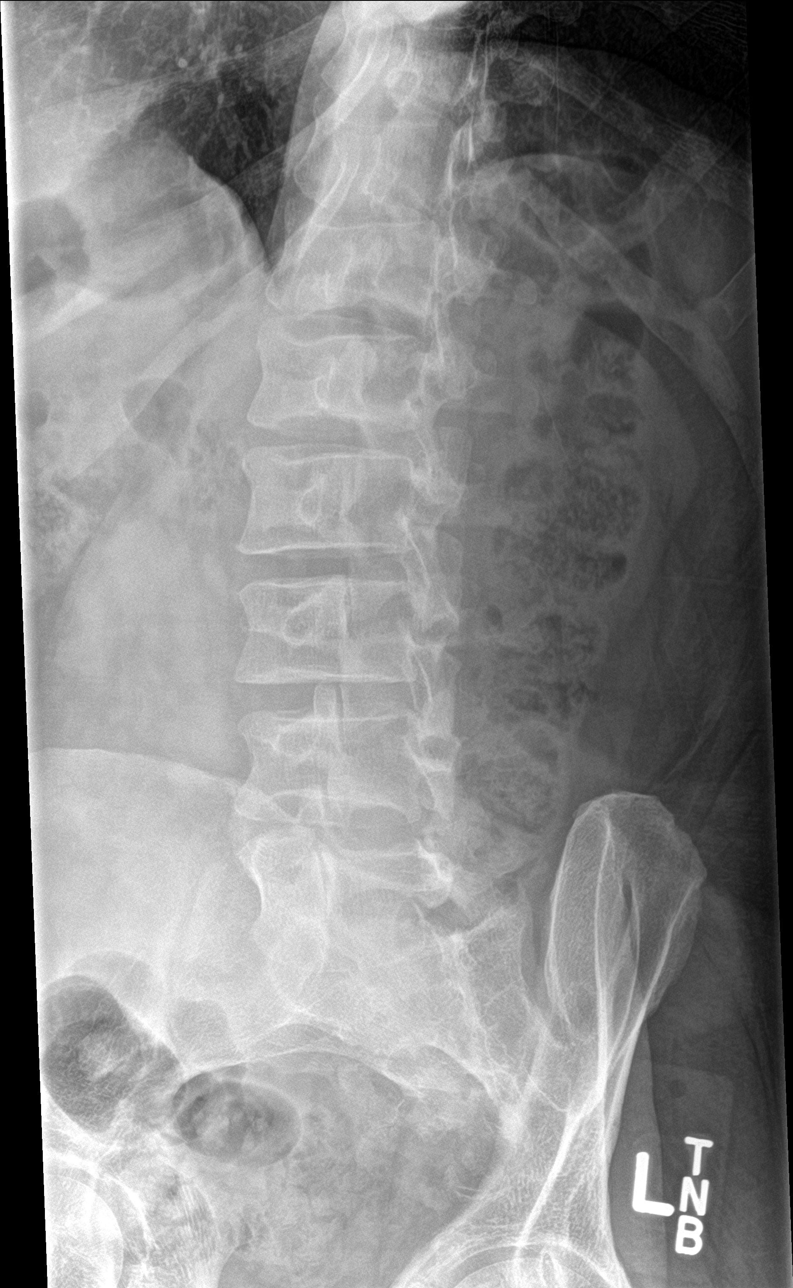

[l-spine obl (2 of 2)]
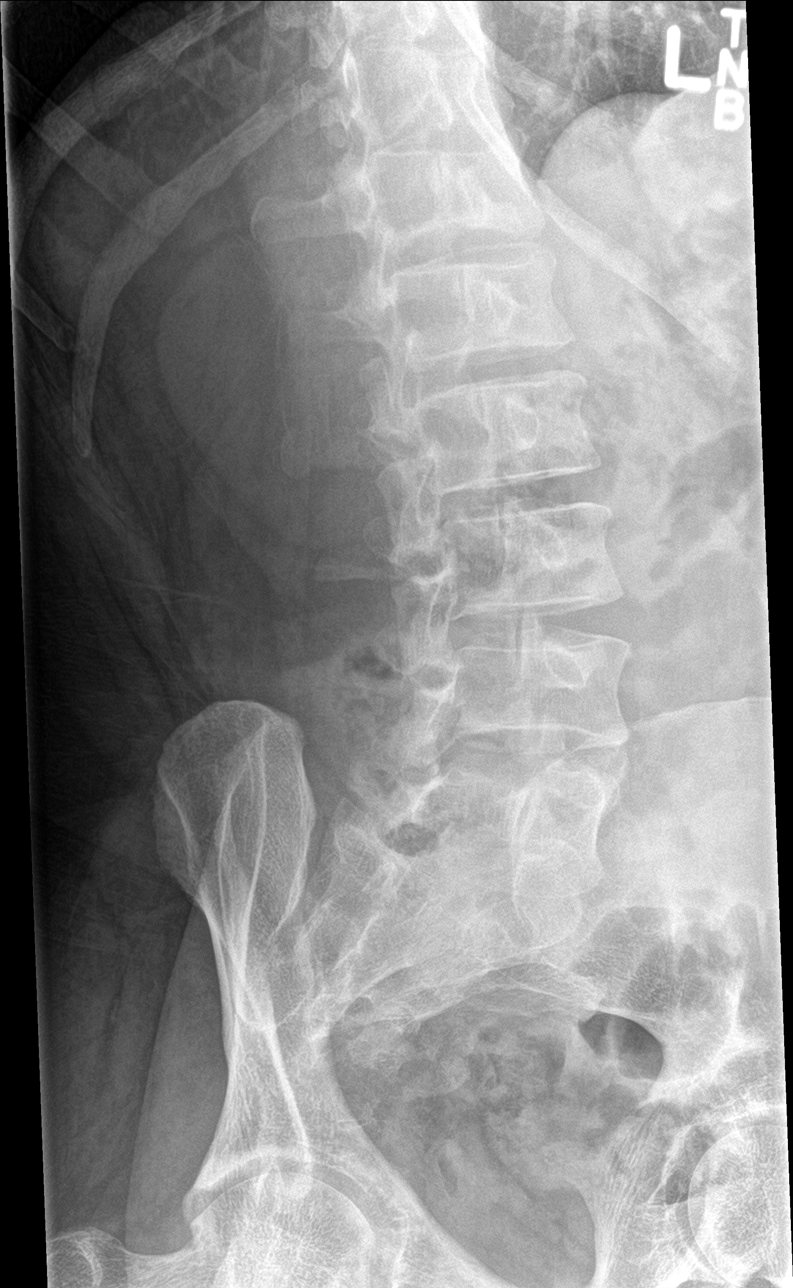

[l-spine lat]
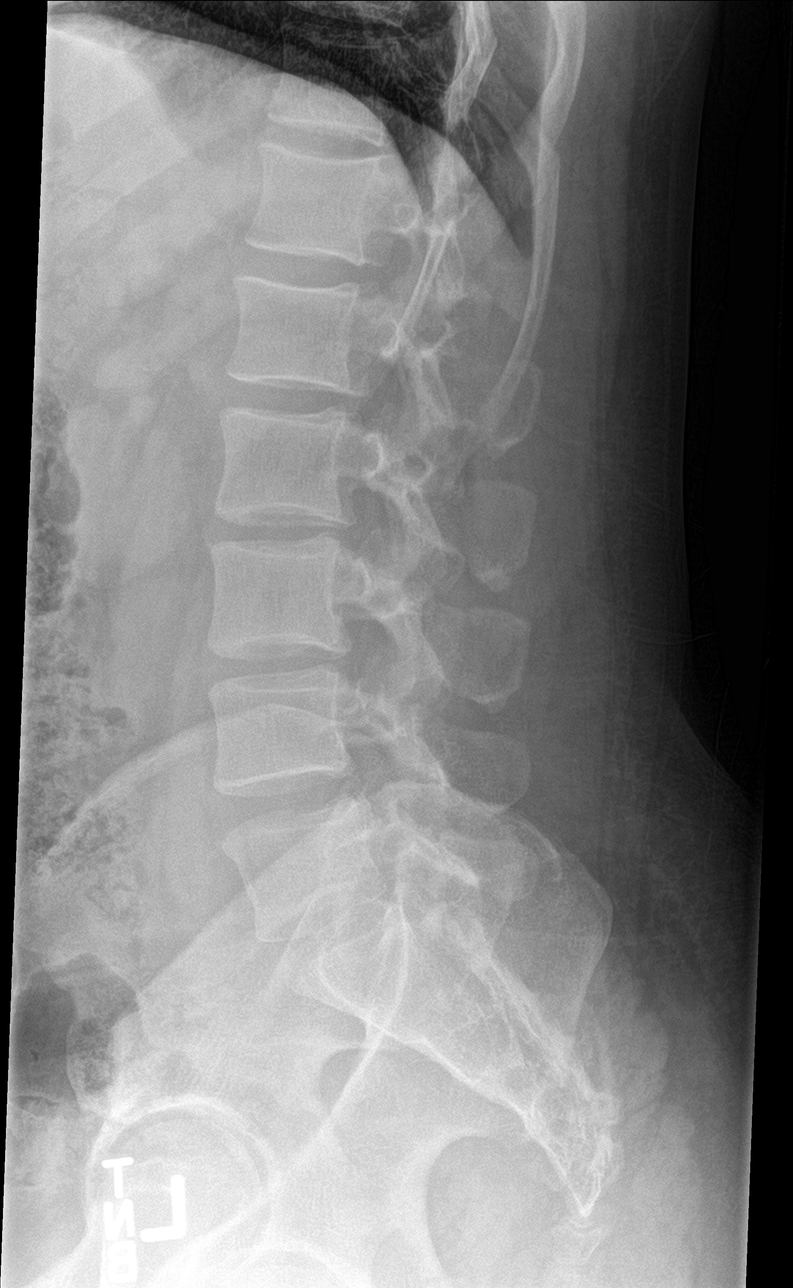

[l-spine spot]
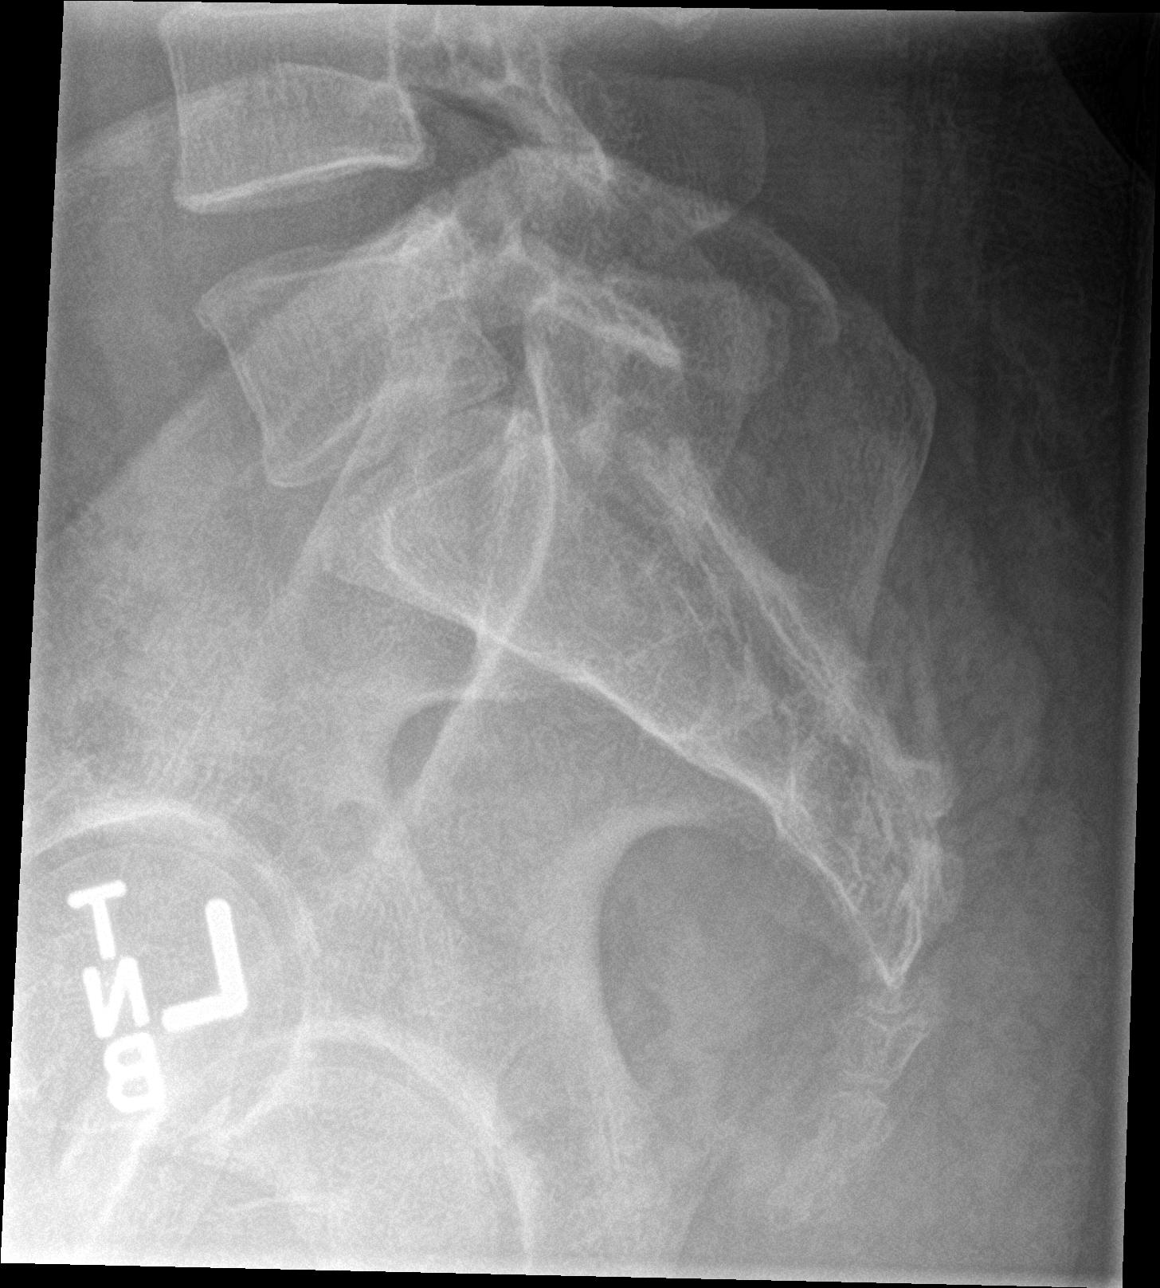

[5 of 5 positions shown; findings below may reference images not displayed]

FINDINGS: There is no evidence of lumbar spine fracture. Alignment is normal.
Intervertebral disc spaces are maintained.
IMPRESSION: No acute abnormality noted.

## 2019-01-18 ENCOUNTER — Other Ambulatory Visit: Payer: Self-pay | Admitting: Physician Assistant

## 2020-12-02 ENCOUNTER — Encounter: Payer: Self-pay | Admitting: Nurse Practitioner

## 2020-12-02 ENCOUNTER — Telehealth: Payer: Self-pay

## 2020-12-02 ENCOUNTER — Ambulatory Visit (INDEPENDENT_AMBULATORY_CARE_PROVIDER_SITE_OTHER): Payer: Self-pay | Admitting: Nurse Practitioner

## 2020-12-02 ENCOUNTER — Other Ambulatory Visit: Payer: Self-pay

## 2020-12-02 VITALS — BP 169/94 | HR 77 | Temp 98.4°F | Resp 20 | Ht 69.0 in | Wt 255.0 lb

## 2020-12-02 DIAGNOSIS — M545 Low back pain, unspecified: Secondary | ICD-10-CM

## 2020-12-02 DIAGNOSIS — I10 Essential (primary) hypertension: Secondary | ICD-10-CM

## 2020-12-02 DIAGNOSIS — G8929 Other chronic pain: Secondary | ICD-10-CM

## 2020-12-02 DIAGNOSIS — R030 Elevated blood-pressure reading, without diagnosis of hypertension: Secondary | ICD-10-CM | POA: Insufficient documentation

## 2020-12-02 DIAGNOSIS — Z7689 Persons encountering health services in other specified circumstances: Secondary | ICD-10-CM | POA: Insufficient documentation

## 2020-12-02 DIAGNOSIS — F419 Anxiety disorder, unspecified: Secondary | ICD-10-CM | POA: Insufficient documentation

## 2020-12-02 MED ORDER — RISPERIDONE 1 MG PO TABS: 1.0000 mg | ORAL_TABLET | Freq: Every day | ORAL | 2 refills | Status: AC

## 2020-12-02 MED ORDER — LISINOPRIL-HYDROCHLOROTHIAZIDE 20-12.5 MG PO TABS: 1.0000 | ORAL_TABLET | Freq: Every day | ORAL | 2 refills | Status: DC

## 2020-12-02 MED ORDER — NAPROXEN 500 MG PO TABS: 500.0000 mg | ORAL_TABLET | Freq: Two times a day (BID) | ORAL | 0 refills | Status: DC

## 2020-12-02 MED ORDER — PREDNISONE 10 MG (21) PO TBPK: ORAL_TABLET | ORAL | 0 refills | Status: DC

## 2020-12-02 MED ORDER — HYDROXYZINE HCL 10 MG PO TABS: 10.0000 mg | ORAL_TABLET | Freq: Three times a day (TID) | ORAL | 0 refills | Status: AC | PRN

## 2020-12-02 MED ORDER — ATENOLOL 25 MG PO TABS: 25.0000 mg | ORAL_TABLET | Freq: Every day | ORAL | 1 refills | Status: DC

## 2020-12-02 MED ORDER — PRAZOSIN HCL 1 MG PO CAPS: 1.0000 mg | ORAL_CAPSULE | Freq: Three times a day (TID) | ORAL | 20 refills | Status: DC

## 2020-12-02 NOTE — Assessment & Plan Note (Signed)
Essential hypertension well-controlled on atenolol 25 mg tablet by mouth daily, Zestoretic 20-12.5 mg tablet by mouth daily.  Continue healthy low-sodium diet and exercise regimen as tolerated.  Education provided with printed handouts given. Rx refill sent to pharmacy.

## 2020-12-02 NOTE — Assessment & Plan Note (Signed)
Anxiety not well controlled.  Completed GAD-7, PHQ-9.  Patient is followed by psychiatry for bipolar.  Has not been seen in the last 1 year.  Patient is reporting that current medication risperidone 1 mg tablet 3 times daily is not therapeutic.  Provided education to patient with printed handout given advised patient to go back to psych for reevaluation. Started patient on hydroxyzine 10 mg tablet as needed for anxiety.  Patient denies any suicidal ideations, thoughts of harming self and others.  Rx sent to pharmacy  Follow-up with worsening or unresolved symptoms.

## 2020-12-02 NOTE — Patient Instructions (Addendum)
Acute Back Pain, Adult Acute back pain is sudden and usually short-lived. It is often caused by an injury to the muscles and tissues in the back. The injury may result from:  A muscle or ligament getting overstretched or torn (strained). Ligaments are tissues that connect bones to each other. Lifting something improperly can cause a back strain.  Wear and tear (degeneration) of the spinal disks. Spinal disks are circular tissue that provides cushioning between the bones of the spine (vertebrae).  Twisting motions, such as while playing sports or doing yard work.  A hit to the back.  Arthritis. You may have a physical exam, lab tests, and imaging tests to find the cause of your pain. Acute back pain usually goes away with rest and home care. Follow these instructions at home: Managing pain, stiffness, and swelling  Take over-the-counter and prescription medicines only as told by your health care provider.  Your health care provider may recommend applying ice during the first 24-48 hours after your pain starts. To do this: ? Put ice in a plastic bag. ? Place a towel between your skin and the bag. ? Leave the ice on for 20 minutes, 2-3 times a day.  If directed, apply heat to the affected area as often as told by your health care provider. Use the heat source that your health care provider recommends, such as a moist heat pack or a heating pad. ? Place a towel between your skin and the heat source. ? Leave the heat on for 20-30 minutes. ? Remove the heat if your skin turns bright red. This is especially important if you are unable to feel pain, heat, or cold. You have a greater risk of getting burned. Activity   Do not stay in bed. Staying in bed for more than 1-2 days can delay your recovery.  Sit up and stand up straight. Avoid leaning forward when you sit, or hunching over when you stand. ? If you work at a desk, sit close to it so you do not need to lean over. Keep your chin tucked  in. Keep your neck drawn back, and keep your elbows bent at a right angle. Your arms should look like the letter "L." ? Sit high and close to the steering wheel when you drive. Add lower back (lumbar) support to your car seat, if needed.  Take short walks on even surfaces as soon as you are able. Try to increase the length of time you walk each day.  Do not sit, drive, or stand in one place for more than 30 minutes at a time. Sitting or standing for long periods of time can put stress on your back.  Do not drive or use heavy machinery while taking prescription pain medicine.  Use proper lifting techniques. When you bend and lift, use positions that put less stress on your back: ? [place]shaw your knees. ? Keep the load close to your body. ? Avoid twisting.  Exercise regularly as told by your health care provider. Exercising helps your back heal faster and helps prevent back injuries by keeping muscles strong and flexible.  Work with a physical therapist to make a safe exercise program, as recommended by your health care provider. Do any exercises as told by your physical therapist. Lifestyle  Maintain a healthy weight. Extra weight puts stress on your back and makes it difficult to have good posture.  Avoid activities or situations that make you feel anxious or stressed. Stress and anxiety increase muscle  tension and can make back pain worse. Learn ways to manage anxiety and stress, such as through exercise. General instructions  Sleep on a firm mattress in a comfortable position. Try lying on your side with your knees slightly bent. If you lie on your back, put a pillow under your knees.  Follow your treatment plan as told by your health care provider. This may include: ? Cognitive or behavioral therapy. ? Acupuncture or massage therapy. ? Meditation or yoga. Contact a health care provider if:  You have pain that is not relieved with rest or medicine.  You have increasing pain going down  into your legs or buttocks.  Your pain does not improve after 2 weeks.  You have pain at night.  You lose weight without trying.  You have a fever or chills. Get help right away if:  You develop new bowel or bladder control problems.  You have unusual weakness or numbness in your arms or legs.  You develop nausea or vomiting.  You develop abdominal pain.  You feel faint. Summary  Acute back pain is sudden and usually short-lived.  Use proper lifting techniques. When you bend and lift, use positions that put less stress on your back.  Take over-the-counter and prescription medicines and apply heat or ice as directed by your health care provider. This information is not intended to replace advice given to you by your health care provider. Make sure you discuss any questions you have with your health care provider. Document Revised: 03/28/2019 Document Reviewed: 07/21/2017 Elsevier Patient Education  2020 [employer] Generalized Anxiety Disorder, Adult Generalized anxiety disorder (GAD) is a mental health disorder. People with this condition constantly worry about everyday events. Unlike normal anxiety, worry related to GAD is not triggered by a specific event. These worries also do not fade or get better with time. GAD interferes with life functions, including relationships, work, and school. GAD can vary from mild to severe. People with severe GAD can have intense waves of anxiety with physical symptoms (panic attacks). What are the causes? The exact cause of GAD is not known. What increases the risk? This condition is more likely to develop in:  Women.  People who have a family history of anxiety disorders.  People who are very shy.  People who experience very stressful life events, such as the death of a loved one.  People who have a very stressful family environment. What are the signs or symptoms? People with GAD often worry excessively about many things in their  lives, such as their health and family. They may also be overly concerned about:  Doing well at work.  Being on time.  Natural disasters.  Friendships. Physical symptoms of GAD include:  Fatigue.  Muscle tension or having muscle twitches.  Trembling or feeling shaky.  Being easily startled.  Feeling like your heart is pounding or racing.  Feeling out of breath or like you cannot take a deep breath.  Having trouble falling asleep or staying asleep.  Sweating.  Nausea, diarrhea, or irritable bowel syndrome (IBS).  Headaches.  Trouble concentrating or remembering facts.  Restlessness.  Irritability. How is this diagnosed? Your health care provider can diagnose GAD based on your symptoms and medical history. You will also have a physical exam. The health care provider will ask specific questions about your symptoms, including how severe they are, when they started, and if they come and go. Your health care provider may ask you about your use of alcohol or  drugs, including prescription medicines. Your health care provider may refer you to a mental health specialist for further evaluation. Your health care provider will do a thorough examination and may perform additional tests to rule out other possible causes of your symptoms. To be diagnosed with GAD, a person must have anxiety that:  Is out of his or her control.  Affects several different aspects of his or her life, such as work and relationships.  Causes distress that makes him or her unable to take part in normal activities.  Includes at least three physical symptoms of GAD, such as restlessness, fatigue, trouble concentrating, irritability, muscle tension, or sleep problems. Before your health care provider can confirm a diagnosis of GAD, these symptoms must be present more days than they are not, and they must last for six months or longer. How is this treated? The following therapies are usually used to treat  GAD:  Medicine. Antidepressant medicine is usually prescribed for long-term daily control. Antianxiety medicines may be added in severe cases, especially when panic attacks occur.  Talk therapy (psychotherapy). Certain types of talk therapy can be helpful in treating GAD by providing support, education, and guidance. Options include: ? Cognitive behavioral therapy (CBT). People learn coping skills and techniques to ease their anxiety. They learn to identify unrealistic or negative thoughts and behaviors and to replace them with positive ones. ? Acceptance and commitment therapy (ACT). This treatment teaches people how to be mindful as a way to cope with unwanted thoughts and feelings. ? Biofeedback. This process trains you to manage your body's response (physiological response) through breathing techniques and relaxation methods. You will work with a therapist while machines are used to monitor your physical symptoms.  Stress management techniques. These include yoga, meditation, and exercise. A mental health specialist can help determine which treatment is best for you. Some people see improvement with one type of therapy. However, other people require a combination of therapies. Follow these instructions at home:  Take over-the-counter and prescription medicines only as told by your health care provider.  Try to maintain a normal routine.  Try to anticipate stressful situations and allow extra time to manage them.  Practice any stress management or self-calming techniques as taught by your health care provider.  Do not punish yourself for setbacks or for not making progress.  Try to recognize your accomplishments, even if they are small.  Keep all follow-up visits as told by your health care provider. This is important. Contact a health care provider if:  Your symptoms do not get better.  Your symptoms get worse.  You have signs of depression, such as: ? A persistently sad, cranky,  or irritable mood. ? Loss of enjoyment in activities that used to bring you joy. ? Change in weight or eating. ? Changes in sleeping habits. ? Avoiding friends or family members. ? Loss of energy for normal tasks. ? Feelings of guilt or worthlessness. Get help right away if:  You have serious thoughts about hurting yourself or others. If you ever feel like you may hurt yourself or others, or have thoughts about taking your own life, get help right away. You can go to your nearest emergency department or call:  Your local emergency services (911 in the [place]  A suicide crisis helpline, such as the National Suicide Prevention Lifeline at 402-198-9375. This is open 24 hours a day. Summary  Generalized anxiety disorder (GAD) is a mental health disorder that involves worry that is not triggered by  a specific event.  People with GAD often worry excessively about many things in their lives, such as their health and family.  GAD may cause physical symptoms such as restlessness, trouble concentrating, sleep problems, frequent sweating, nausea, diarrhea, headaches, and trembling or muscle twitching.  A mental health specialist can help determine which treatment is best for you. Some people see improvement with one type of therapy. However, other people require a combination of therapies. This information is not intended to replace advice given to you by your health care provider. Make sure you discuss any questions you have with your health care provider. Document Revised: 11/19/2017 Document Reviewed: 10/27/2016 Elsevier Patient Education  2020 [employer] Hypertension, Adult Hypertension is another name for high blood pressure. High blood pressure forces your heart to work harder to pump blood. This can cause problems over time. There are two numbers in a blood pressure reading. There is a top number (systolic) over a bottom number (diastolic). It is best to have a blood pressure that is  below 120/80. Healthy choices can help lower your blood pressure, or you may need medicine to help lower it. What are the causes? The cause of this condition is not known. Some conditions may be related to high blood pressure. What increases the risk?  Smoking.  Having type 2 diabetes mellitus, high cholesterol, or both.  Not getting enough exercise or physical activity.  Being overweight.  Having too much fat, sugar, calories, or salt (sodium) in your diet.  Drinking too much alcohol.  Having long-term (chronic) kidney disease.  Having a family history of high blood pressure.  Age. Risk increases with age.  Race. You may be at higher risk if you are African American.  Gender. Men are at higher risk than women before age 42. After age 42, women are at higher risk than men.  Having obstructive sleep apnea.  Stress. What are the signs or symptoms?  High blood pressure may not cause symptoms. Very high blood pressure (hypertensive crisis) may cause: ? Headache. ? Feelings of worry or nervousness (anxiety). ? Shortness of breath. ? Nosebleed. ? A feeling of being sick to your stomach (nausea). ? Throwing up (vomiting). ? Changes in how you see. ? Very bad chest pain. ? Seizures. How is this treated?  This condition is treated by making healthy lifestyle changes, such as: ? Eating healthy foods. ? Exercising more. ? Drinking less alcohol.  Your health care provider may prescribe medicine if lifestyle changes are not enough to get your blood pressure under control, and if: ? Your top number is above 130. ? Your bottom number is above 80.  Your personal target blood pressure may vary. Follow these instructions at home: Eating and drinking   If told, follow the DASH eating plan. To follow this plan: ? Fill one half of your plate at each meal with fruits and vegetables. ? Fill one fourth of your plate at each meal with whole grains. Whole grains include whole-wheat  pasta, brown rice, and whole-grain bread. ? Eat or drink low-fat dairy products, such as skim milk or low-fat yogurt. ? Fill one fourth of your plate at each meal with low-fat (lean) proteins. Low-fat proteins include fish, chicken without skin, eggs, beans, and tofu. ? Avoid fatty meat, cured and processed meat, or chicken with skin. ? Avoid pre-made or processed food.  Eat less than 1,500 mg of salt each day.  Do not drink alcohol if: ? Your doctor tells you not to  drink. ? You are pregnant, may be pregnant, or are planning to become pregnant.  If you drink alcohol: ? Limit how much you use to:  0-1 drink a day for women.  0-2 drinks a day for men. ? Be aware of how much alcohol is in your drink. In the [place] one drink equals one 12 oz bottle of beer (355 mL), one 5 oz glass of wine (148 mL), or one 1 oz glass of hard liquor (44 mL). Lifestyle   Work with your doctor to stay at a healthy weight or to lose weight. Ask your doctor what the best weight is for you.  Get at least 30 minutes of exercise most days of the week. This may include walking, swimming, or biking.  Get at least 30 minutes of exercise that strengthens your muscles (resistance exercise) at least 3 days a week. This may include lifting weights or doing Pilates.  Do not use any products that contain nicotine or tobacco, such as cigarettes, e-cigarettes, and chewing tobacco. If you need help quitting, ask your doctor.  Check your blood pressure at home as told by your doctor.  Keep all follow-up visits as told by your doctor. This is important. Medicines  Take over-the-counter and prescription medicines only as told by your doctor. Follow directions carefully.  Do not skip doses of blood pressure medicine. The medicine does not work as well if you skip doses. Skipping doses also puts you at risk for problems.  Ask your doctor about side effects or reactions to medicines that you should watch for. Contact a  doctor if you:  Think you are having a reaction to the medicine you are taking.  Have headaches that keep coming back (recurring).  Feel dizzy.  Have swelling in your ankles.  Have trouble with your vision. Get help right away if you:  Get a very bad headache.  Start to feel mixed up (confused).  Feel weak or numb.  Feel faint.  Have very bad pain in your: ? Chest. ? Belly (abdomen).  Throw up more than once.  Have trouble breathing. Summary  Hypertension is another name for high blood pressure.  High blood pressure forces your heart to work harder to pump blood.  For most people, a normal blood pressure is less than 120/80.  Making healthy choices can help lower blood pressure. If your blood pressure does not get lower with healthy choices, you may need to take medicine. This information is not intended to replace advice given to you by your health care provider. Make sure you discuss any questions you have with your health care provider. Document Revised: 08/17/2018 Document Reviewed: 08/17/2018 Elsevier Patient Education  2020 [employer].

## 2020-12-02 NOTE — Progress Notes (Signed)
New Patient Note  RE: Paul Kidd MRN: [id number] DOB: 1976-09-17 Date of Office Visit: 12/02/2020  Chief Complaint: New Patient (Initial Visit) (Est care )  History of Present Illness: Patient is a 44 year old male who presents for follow up of hypertension. Patient was diagnosed in 07/05/2017. The patient is tolerating the medication well without side effects. Compliance with treatment has been good; including taking medication as directed , maintains a healthy diet and regular exercise regimen , and following up as directed.  Current medication atenolol 25 mg tablet daily by mouth, lisinopril-hydrochlorothiazide 20-12.5 mg 1 tablet by mouth daily.  Anxiety: Patient complains of anxiety disorder and bipolar disorder.  He has the following symptoms: difficulty concentrating, insomnia, irritable, palpitations, shortness of breath. Onset of symptoms was approximately a few weeks ago, unchanged since that time. He denies current suicidal and homicidal ideation. Family history significant for no psychiatric illness.Possible organic causes contributing are: none. Risk factors: previous episode of depression and personality disorder Previous treatment includes Risperidone .  He complains of the following side effects from the treatment: Somnolence and lethargy  Pain  He reports chronic lower back pain. was an injury that may have caused the pain. The pain started a few years ago and is gradually worsening. The pain does not radiate . The pain is described as aching and soreness, is moderate in intensity, occurring intermittently. Symptoms are worse in the: morning, mid-day, afternoon  Aggravating factors: bending backwards, bending forwards, bending sideways and walking Relieving factors: medication Anti-inflammatory.  He has tried application of heat, NSAIDs and oral steroids with mild relief.    ---------------------------------------------------------------------------------------------------  Flowsheet Row Office Visit from 12/02/2020 in [facility]  PHQ-9 Total Score 17    . GAD 7 : Generalized Anxiety Score 12/02/2020  Nervous, Anxious, on Edge 3  Control/stop worrying 3  Worry too much - different things 3  Trouble relaxing 3  Restless 3  Easily annoyed or irritable 3  Afraid - awful might happen 3  Total GAD 7 Score 21     Assessment and Plan: Paul Kidd is a 44 y.o. male with: Essential hypertension Essential hypertension well-controlled on atenolol 25 mg tablet by mouth daily, Zestoretic 20-12.5 mg tablet by mouth daily.  Continue healthy low-sodium diet and exercise regimen as tolerated.  Education provided with printed handouts given. Rx refill sent to pharmacy.   Anxiety Anxiety not well controlled.  Completed GAD-7, PHQ-9.  Patient is followed by psychiatry for bipolar.  Has not been seen in the last 1 year.  Patient is reporting that current medication risperidone 1 mg tablet 3 times daily is not therapeutic.  Provided education to patient with printed handout given advised patient to go back to psych for reevaluation. Started patient on hydroxyzine 10 mg tablet as needed for anxiety.  Patient denies any suicidal ideations, thoughts of harming self and others.  Rx sent to pharmacy  Follow-up with worsening or unresolved symptoms.  Chronic midline low back pain without sciatica This is a chronic problem for patient.  Patient reports playing basketball over 20 years ago and landed on the wrong foot and ever since has had problems with his back and left knee. Started patient on prednisone pack, ice compress, naproxen 500 mg twice daily as needed for pain. Follow-up with worsening or unresolved symptoms  In the future may consider physical therapy, and orthopedic follow-up.  Rx sent to pharmacy.    Diagnostics:   Past Medical  History: Patient Active Problem List  Diagnosis Date Noted  . Elevated blood pressure reading 12/02/2020  . Anxiety 12/02/2020  . Establishing care with new doctor, encounter for 12/02/2020  . Chronic midline low back pain without sciatica 07/26/2017  . Hyperlipidemia 07/26/2017  . Essential hypertension 07/05/2017   Past Medical History:  Diagnosis Date  . Depression    PTSD, bi polar   . GERD (gastroesophageal reflux disease)   . Hypertension    Past Surgical History: History reviewed. No pertinent surgical history. Medication List:  Current Outpatient Medications  Medication Sig Dispense Refill  . atenolol (TENORMIN) 25 MG tablet Take 1 tablet (25 mg total) by mouth daily. 30 tablet 1  . hydrOXYzine (ATARAX/VISTARIL) 10 MG tablet Take 1 tablet (10 mg total) by mouth 3 (three) times daily as needed. 30 tablet 0  . lisinopril-hydrochlorothiazide (ZESTORETIC) 20-12.5 MG tablet Take 1 tablet by mouth daily. 30 tablet 2  . naproxen (NAPROSYN) 500 MG tablet Take 1 tablet (500 mg total) by mouth 2 (two) times daily with a meal. 30 tablet 0  . prazosin (MINIPRESS) 1 MG capsule Take 1 capsule (1 mg total) by mouth 3 (three) times daily. 30 capsule 20  . predniSONE (STERAPRED UNI-PAK 21 TAB) 10 MG (21) TBPK tablet 6 tablets by mouth day 1, 5 tablets day 2, 4 tablets day 3, 3 tablets day 4, 2 tablets day 5, 1 tablet day 6. 1 each 0  . risperiDONE (RISPERDAL) 1 MG tablet Take 1 tablet (1 mg total) by mouth at bedtime. 30 tablet 2   No current facility-administered medications for this visit.   Allergies: No Known Allergies Social History: Social History   Socioeconomic History  . Marital status: Unknown    Spouse name: Not on file  . Number of children: Not on file  . Years of education: Not on file  . Highest education level: Not on file  Occupational History  . Not on file  Tobacco Use  . Smoking status: Never Smoker  . Smokeless tobacco: Never Used  Vaping Use  . Vaping  Use: Never used  Substance and Sexual Activity  . Alcohol use: No  . Drug use: No  . Sexual activity: Yes    Birth control/protection: None  Other Topics Concern  . Not on file  Social History Narrative  . Not on file   Social Determinants of Health   Financial Resource Strain: Not on file  Food Insecurity: Not on file  Transportation Needs: Not on file  Physical Activity: Not on file  Stress: Not on file  Social Connections: Not on file       Family History: Family History  Problem Relation Age of Onset  . Cancer Mother        Lung Cancer  . Depression Mother   . Suicidality Mother   . Hypertension Father   . Hyperlipidemia Father   . Hypertension Maternal Grandmother   . Stroke Maternal Grandmother   . Arthritis Paternal Grandmother   . Hypertension Paternal Grandmother   . Thyroid disease Maternal Aunt          Review of Systems  Respiratory: Negative for cough and shortness of breath.   Cardiovascular: Positive for palpitations.  Musculoskeletal: Positive for back pain.  All other systems reviewed and are negative.  Objective: BP (!) 169/94   Pulse 77   Temp 98.4 F (36.9 C)   Resp 20   Ht 5\' 9"  (1.753 m)   Wt 255 lb (115.7 kg)   SpO2 95%  BMI 37.66 kg/m  Body mass index is 37.66 kg/m. Physical Exam Vitals reviewed. Exam conducted with a chaperone present (Girlfriend).  Constitutional:      General: He is awake.     Appearance: Normal appearance. He is overweight.     Interventions: Face mask in place.  HENT:     Head: Normocephalic.     Nose: Nose normal.  Eyes:     Pupils: Pupils are equal, round, and reactive to light.  Cardiovascular:     Rate and Rhythm: Normal rate and regular rhythm.     Pulses: Normal pulses.     Heart sounds: Normal heart sounds.  Pulmonary:     Effort: Pulmonary effort is normal.     Breath sounds: Normal breath sounds.  Abdominal:     General: Bowel sounds are normal.  Musculoskeletal:        General:  Normal range of motion.  Skin:    General: Skin is warm.  Neurological:     Mental Status: He is alert and oriented to person, place, and time.  Psychiatric:        Mood and Affect: Mood normal.        Behavior: Behavior normal. Behavior is cooperative.    The plan was reviewed with the patient/family, and all questions/concerned were addressed.  It was my pleasure to see Paul Kidd today and participate in his care. Please feel free to contact me with any questions or concerns.  Sincerely,  Lynnell Chad NP [facility]

## 2020-12-02 NOTE — Assessment & Plan Note (Signed)
This is a chronic problem for patient.  Patient reports playing basketball over 20 years ago and landed on the wrong foot and ever since has had problems with his back and left knee. Started patient on prednisone pack, ice compress, naproxen 500 mg twice daily as needed for pain. Follow-up with worsening or unresolved symptoms  In the future may consider physical therapy, and orthopedic follow-up.  Rx sent to pharmacy.

## 2020-12-02 NOTE — Telephone Encounter (Signed)
Medication sent to pharmacy  

## 2020-12-03 LAB — CBC WITH DIFFERENTIAL/PLATELET
Basophils Absolute: 0 10*3/uL (ref 0.0–0.2)
Basos: 1 %
EOS (ABSOLUTE): 0.1 10*3/uL (ref 0.0–0.4)
Eos: 2 %
Hematocrit: 41.3 % (ref 37.5–51.0)
Hemoglobin: 14.1 g/dL (ref 13.0–17.7)
Immature Grans (Abs): 0 10*3/uL (ref 0.0–0.1)
Immature Granulocytes: 0 %
Lymphocytes Absolute: 1.3 10*3/uL (ref 0.7–3.1)
Lymphs: 18 %
MCH: 29.1 pg (ref 26.6–33.0)
MCHC: 34.1 g/dL (ref 31.5–35.7)
MCV: 85 fL (ref 79–97)
Monocytes Absolute: 0.6 10*3/uL (ref 0.1–0.9)
Monocytes: 9 %
Neutrophils Absolute: 5.2 10*3/uL (ref 1.4–7.0)
Neutrophils: 70 %
Platelets: 273 10*3/uL (ref 150–450)
RBC: 4.85 x10E6/uL (ref 4.14–5.80)
RDW: 12.3 % (ref 11.6–15.4)
WBC: 7.2 10*3/uL (ref 3.4–10.8)

## 2020-12-03 LAB — COMPREHENSIVE METABOLIC PANEL
ALT: 10 IU/L (ref 0–44)
AST: 19 IU/L (ref 0–40)
Albumin/Globulin Ratio: 1.6 (ref 1.2–2.2)
Albumin: 4.9 g/dL (ref 4.0–5.0)
Alkaline Phosphatase: 64 IU/L (ref 44–121)
BUN/Creatinine Ratio: 11 (ref 9–20)
BUN: 13 mg/dL (ref 6–24)
Bilirubin Total: 0.9 mg/dL (ref 0.0–1.2)
CO2: 25 mmol/L (ref 20–29)
Calcium: 9.8 mg/dL (ref 8.7–10.2)
Chloride: 100 mmol/L (ref 96–106)
Creatinine, Ser: 1.21 mg/dL (ref 0.76–1.27)
GFR calc Af Amer: 85 mL/min/{1.73_m2} (ref >59)
GFR calc non Af Amer: 73 mL/min/{1.73_m2} (ref >59)
Globulin, Total: 3.1 g/dL (ref 1.5–4.5)
Glucose: 96 mg/dL (ref 65–99)
Potassium: 4 mmol/L (ref 3.5–5.2)
Sodium: 141 mmol/L (ref 134–144)
Total Protein: 8 g/dL (ref 6.0–8.5)

## 2020-12-03 LAB — LIPID PANEL
Chol/HDL Ratio: 4.5 ratio (ref 0.0–5.0)
Cholesterol, Total: 218 mg/dL — ABNORMAL HIGH (ref 100–199)
HDL: 48 mg/dL (ref >39)
LDL Chol Calc (NIH): 143 mg/dL — ABNORMAL HIGH (ref 0–99)
Triglycerides: 149 mg/dL (ref 0–149)
VLDL Cholesterol Cal: 27 mg/dL (ref 5–40)

## 2020-12-12 NOTE — Telephone Encounter (Signed)
Multiple attempts made to contact patient.  This encounter will now be closed

## 2020-12-30 ENCOUNTER — Ambulatory Visit: Payer: Self-pay | Admitting: Nurse Practitioner

## 2021-04-02 ENCOUNTER — Other Ambulatory Visit: Payer: Self-pay | Admitting: *Deleted

## 2021-04-02 DIAGNOSIS — R030 Elevated blood-pressure reading, without diagnosis of hypertension: Secondary | ICD-10-CM

## 2021-04-02 MED ORDER — ATENOLOL 25 MG PO TABS: 25.0000 mg | ORAL_TABLET | Freq: Every day | ORAL | 0 refills | Status: DC

## 2021-05-27 ENCOUNTER — Other Ambulatory Visit: Payer: Self-pay | Admitting: *Deleted

## 2021-05-27 DIAGNOSIS — R030 Elevated blood-pressure reading, without diagnosis of hypertension: Secondary | ICD-10-CM

## 2021-05-27 NOTE — Telephone Encounter (Signed)
Je NTBS 30 days given 04/02/21

## 2021-07-17 ENCOUNTER — Telehealth: Payer: Self-pay | Admitting: Nurse Practitioner

## 2021-07-17 NOTE — Telephone Encounter (Signed)
Will ask Je if ok to change providers

## 2021-07-17 NOTE — Telephone Encounter (Signed)
Pt does not want to see Je and would prefer to see a male dr. [name] be willing to take on pt?

## 2022-09-24 ENCOUNTER — Ambulatory Visit (INDEPENDENT_AMBULATORY_CARE_PROVIDER_SITE_OTHER): Payer: Self-pay | Admitting: Orthopaedic Surgery

## 2022-09-24 ENCOUNTER — Ambulatory Visit (INDEPENDENT_AMBULATORY_CARE_PROVIDER_SITE_OTHER): Payer: Self-pay

## 2022-09-24 ENCOUNTER — Encounter: Payer: Self-pay | Admitting: Orthopaedic Surgery

## 2022-09-24 VITALS — Ht 69.0 in | Wt 260.0 lb

## 2022-09-24 DIAGNOSIS — M545 Low back pain, unspecified: Secondary | ICD-10-CM

## 2022-09-24 DIAGNOSIS — G8929 Other chronic pain: Secondary | ICD-10-CM

## 2022-09-24 NOTE — Progress Notes (Signed)
Office Visit Note   Patient: Paul Kidd           Date of Birth: 02-24-1976           MRN: [id number] Visit Date: 09/24/2022              Requested by: Gwenlyn Saran [facility] [place],  [place] [address] PCP: Gwenlyn Saran, [place]: Visit Diagnoses:  1. Chronic bilateral low back pain, unspecified whether sciatica present     Plan: No evidence of radiculopathy we discussed the cost of MRI imaging.  No evidence of radiculopathy.  No claudication symptoms currently.  Follow-Up Instructions: No follow-ups on file.   Orders:  Orders Placed This Encounter  Procedures   XR Lumbar Spine 2-3 Views   No orders of the defined types were placed in this encounter.     Procedures: No procedures performed   Clinical Data: No additional findings.   Subjective: Chief Complaint  Patient presents with   Lower Back - Pain    HPI 46 year old male here with his partner x10 years.  Patient states he has had chronic low back pain since in his 54s.  Has bipolar disorder anger issues.  He is to lift heavy weights now states he only uses 5 to 10 pounds for toning.  Has back pain that shoots into his left leg sometimes left leg numbness.  He did not get relief with prednisone he has not been through therapy currently does not have any insurance.  He had plain radiographs several years ago and 2 view x-rays obtained today shows no change.  Review of Systems positive bipolar depression issues.  Past history of left possible patellar dislocation with relocation by patient.   Objective: Vital Signs: Ht 5\' 9"  (1.753 m)   Wt 260 lb (117.9 kg)   BMI 38.40 kg/m   Physical Exam Constitutional:      Appearance: He is well-developed.  HENT:     Head: Normocephalic and atraumatic.     Right Ear: External ear normal.     Left Ear: External ear normal.  Eyes:     Pupils: Pupils are equal, round, and reactive to light.  Neck:      Thyroid: No thyromegaly.     Trachea: No tracheal deviation.  Cardiovascular:     Rate and Rhythm: Normal rate.  Pulmonary:     Effort: Pulmonary effort is normal.     Breath sounds: No wheezing.  Abdominal:     General: Bowel sounds are normal.     Palpations: Abdomen is soft.  Musculoskeletal:     Cervical back: Neck supple.  Skin:    General: Skin is warm and dry.     Capillary Refill: Capillary refill takes less than 2 seconds.  Neurological:     Mental Status: He is alert and oriented to person, place, and time.  Psychiatric:        Behavior: Behavior normal.        Thought Content: Thought content normal.        Judgment: Judgment normal.     Ortho Exam reflexes are 2+ and symmetrical left knee tenderness.  Negative patellar apprehension ACL PCL exam is normal negative Lachman negative pivot shift.  Anterior tib EHL gastrocsoleus is strong no lower extremity atrophy negative popliteal compression test.  Specialty Comments:  No specialty comments available.  Imaging: No results found.   PMFS History: Patient Active Problem List  Diagnosis Date Noted   Elevated blood pressure reading 12/02/2020   Anxiety 12/02/2020   Establishing care with new doctor, encounter for 12/02/2020   Chronic midline low back pain without sciatica 07/26/2017   Hyperlipidemia 07/26/2017   Essential hypertension 07/05/2017   Past Medical History:  Diagnosis Date   Depression    PTSD, bi polar    GERD (gastroesophageal reflux disease)    Hypertension     Family History  Problem Relation Age of Onset   Cancer Mother        Lung Cancer   Depression Mother    Suicidality Mother    Hypertension Father    Hyperlipidemia Father    Hypertension Maternal Grandmother    Stroke Maternal Grandmother    Arthritis Paternal Grandmother    Hypertension Paternal Grandmother    Thyroid disease Maternal Aunt     No past surgical history on file. Social History   Occupational History   Not  on file  Tobacco Use   Smoking status: Never   Smokeless tobacco: Never  Vaping Use   Vaping Use: Never used  Substance and Sexual Activity   Alcohol use: No   Drug use: No   Sexual activity: Yes    Birth control/protection: None

## 2022-10-29 ENCOUNTER — Encounter: Payer: Self-pay | Admitting: Physical Medicine & Rehabilitation

## 2022-11-30 ENCOUNTER — Encounter: Payer: Self-pay | Admitting: Physical Medicine & Rehabilitation

## 2022-11-30 ENCOUNTER — Encounter: Payer: Medicaid Other | Attending: Physical Medicine & Rehabilitation | Admitting: Physical Medicine & Rehabilitation

## 2022-11-30 VITALS — BP 180/133 | HR 101 | Ht 69.0 in | Wt 259.2 lb

## 2022-11-30 DIAGNOSIS — G8929 Other chronic pain: Secondary | ICD-10-CM | POA: Insufficient documentation

## 2022-11-30 DIAGNOSIS — M545 Low back pain, unspecified: Secondary | ICD-10-CM | POA: Insufficient documentation

## 2022-11-30 DIAGNOSIS — M797 Fibromyalgia: Secondary | ICD-10-CM | POA: Diagnosis present

## 2022-11-30 DIAGNOSIS — F319 Bipolar disorder, unspecified: Secondary | ICD-10-CM | POA: Insufficient documentation

## 2022-11-30 MED ORDER — PREGABALIN 50 MG PO CAPS: 50.0000 mg | ORAL_CAPSULE | Freq: Two times a day (BID) | ORAL | 3 refills | Status: DC

## 2022-11-30 NOTE — Progress Notes (Unsigned)
Subjective:    Patient ID: Paul Kidd, male    DOB: 1976-10-30, 46 y.o.   MRN: [id number]  HPI Paul Kidd is a 46 y.o. year old male  who  has a past medical history of Depression, GERD (gastroesophageal reflux disease), and Hypertension.   They are presenting to PM&R clinic as a new patient for pain management evaluation. They were referred for treatment of back pain.  Paul Kidd is here for chronic back pain.  Pain is worse in his middle back.  The pain started when he was in his 58s.  He has been gradually worsening over the last 20 years.  He thinks this is related to working at [employer] lifting a lot of heavy boxes.  He also spent a lot of time lifting heavy weights for exercise when he was younger.  Moving around generally helps his pain.  Pain is worsened with sitting down and lying down.  He is here with his girlfriend.  He reports he has difficulty sleeping sometimes.  He is often constipated.  Mood is often decreased due to his bipolar disorder. Patient denies saddle anesthesia, loss of bowel or bladder continence, new weakness, new numbness/tingling, or pain waking up at nighttime.  Medications tried: Ibuprofen, steroids, naproxen, tyle do not help the pain  Oxycodone -reports family member gave him a tab and this helped improve his pain Amitriptyline-takes this for bipolar  Other treatments: PT/OT he tried this in [place] many years ago, reports this did not help   Pain Inventory Average Pain 10 Pain Right Now 10 My pain is aching  In the last 24 hours, has pain interfered with the following? General activity 7 Relation with others 6 Enjoyment of life 8 What TIME of day is your pain at its worst? morning  Sleep (in general) Poor  Pain is worse with: bending and some activites Pain improves with:  . Relief from Meds:  .  walk without assistance how many minutes can you walk? 15 ability to climb steps?  yes do you drive?  no Do you have any goals in this area?   no  disabled: date disabled 2008 Do you have any goals in this area?  yes  depression anxiety suicidal thoughts  Any changes since last visit?  no  Any changes since last visit?  no    Family History  Problem Relation Age of Onset   Cancer Mother        Lung Cancer   Depression Mother    Suicidality Mother    Hypertension Father    Hyperlipidemia Father    Hypertension Maternal Grandmother    Stroke Maternal Grandmother    Arthritis Paternal Grandmother    Hypertension Paternal Grandmother    Thyroid disease Maternal Aunt    Social History   Socioeconomic History   Marital status: Unknown    Spouse name: Not on file   Number of children: Not on file   Years of education: Not on file   Highest education level: Not on file  Occupational History   Not on file  Tobacco Use   Smoking status: Never   Smokeless tobacco: Never  Vaping Use   Vaping Use: Never used  Substance and Sexual Activity   Alcohol use: No   Drug use: No   Sexual activity: Yes    Birth control/protection: None  Other Topics Concern   Not on file  Social History Narrative   Not on file   Social Determinants of Health  Financial Resource Strain: Not on file  Food Insecurity: Not on file  Transportation Needs: Not on file  Physical Activity: Not on file  Stress: Not on file  Social Connections: Not on file   History reviewed. No pertinent surgical history. Past Medical History:  Diagnosis Date   Depression    PTSD, bi polar    GERD (gastroesophageal reflux disease)    Hypertension    BP (!) 180/133   Pulse (!) 101   Ht 5\' 9"  (1.753 m)   Wt 259 lb 3.2 oz (117.6 kg)   SpO2 96%   BMI 38.28 kg/m   Opioid Risk Score:   Fall Risk Score:  `1  Depression screen [facility] 2/9     11/30/2022    1:13 PM 12/02/2020   12:59 PM  Depression screen PHQ 2/9  Decreased Interest 3 0  Down, Depressed, Hopeless 3 3  PHQ - 2 Score 6 3  Altered sleeping 3 3  Tired, decreased energy 3 2   Change in appetite 3 1  Feeling bad or failure about yourself  3 3  Trouble concentrating 3 3  Moving slowly or fidgety/restless 3 2  Suicidal thoughts 3 0  PHQ-9 Score 27 17  Difficult doing work/chores Extremely dIfficult       Review of Systems  Musculoskeletal:  Positive for back pain and gait problem.  All other systems reviewed and are negative.     Objective:   Physical Exam   Gen: no distress, normal appearing HEENT: oral mucosa pink and moist, NCAT Cardio: Reg rate Chest: normal effort, normal rate of breathing Abd: soft, non-distended Ext: no edema Psych: pleasant, normal affect Skin: intact Neuro:  Awake and alert, follows commands, CN 2-12 grossly intact Strength 5/5 in b/l UE and LE Sensation intact to LT in all 4 extremities Musculoskeletal: No joint swelling, no abnormal tone, tenderness to palpation in bilateral shoulders, elbows, wrists, hips, knees (left greater than right), ankles, paraspinal muscles of the lower C-spine T-spine and L-spine, periscapular muscles, QL, lats bilaterally Limited L-spine flexion ROM    L spine xray 09/24/22 AP lateral lumbar images are obtained and reviewed there is minimal lumbar  curvature less than 10 degrees.  Mild spurring of the hips sacroiliac  joints are normal no spondylolisthesis.  Well-maintained disc base height  no spurring no significant facet arthritis.  Comparison 07/07/2017  radiographs.   Impression: Lumbar radiographs unchanged and negative for acute changes.  Assessment & Plan:   Chronic lower back pain without sciatica -While patient has pain in his lower back it appears his pain is much more diffuse and he has significant tenderness in all 4 limbs -Suspect fibromyalgia is worsening his overall pain -He has history of constipation, mood disorder, sleep disorder consistent with fibromyalgia -Start Lyrica 50 mg twice daily -Continue amitriptyline  -Discussed low impact gentle progressive  exercise -Provided list of foods for improving pain and constipation  History of bipolar disorder -Currently on Elavil, caution with consideration of medications and antidepressant class

## 2022-12-22 ENCOUNTER — Telehealth: Payer: Self-pay | Admitting: Physical Medicine & Rehabilitation

## 2022-12-22 NOTE — Telephone Encounter (Signed)
Needs to discuss side effects/reactions patient is having to medications. Experiencing depression, mood changes, dizziness

## 2022-12-24 NOTE — Telephone Encounter (Signed)
He stopped taking Pregabalin  50 mg 2 days ago. His girl-friend stated the mood swings, depression & dizziness are better.    Please advise. Call back phone [phone].

## 2023-01-01 ENCOUNTER — Telehealth: Payer: Self-pay | Admitting: Physical Medicine & Rehabilitation

## 2023-01-01 NOTE — Telephone Encounter (Signed)
Tammy Southern is calling on behalf of the patient, he is needing something for pain.  Please call her at [phone].  Tammy is on patient's DPR, and we are able to speak with her.

## 2023-01-01 NOTE — Telephone Encounter (Signed)
Initially called patient however went to voicemail.  Called Paul Kidd per request and she reported this pain was not well-controlled with pregabalin and he is having a lot of pain at night.  Discussed briefly treatment options including PT, TENS unit however we were not disconnected when I called back there I could not get through.  After this I tried Paul Kidd's number and was able to speak with him.  He reports that he he knows that oxycodone works because he has used this medication as multiple members of his family are prescribed this medication and it helps them.  Discussed that I do not feel that oxycodone would be the best treatment for his pain in my opinion.  He does not want to experiment with any other medications or treatments when he knows that oxycodone has working well for him.  Discussed options and he says he will get an opinion from a different physician on pain management and I agree this is a reasonable plan.

## 2023-01-29 ENCOUNTER — Ambulatory Visit: Payer: Medicaid Other | Admitting: Physical Medicine & Rehabilitation

## 2023-03-08 ENCOUNTER — Encounter: Payer: Self-pay | Admitting: Family Medicine

## 2023-03-08 ENCOUNTER — Ambulatory Visit: Payer: Medicaid Other | Admitting: Family Medicine

## 2023-03-08 VITALS — BP 185/129 | HR 91 | Temp 97.6°F | Ht 69.0 in | Wt 255.0 lb

## 2023-03-08 DIAGNOSIS — I10 Essential (primary) hypertension: Secondary | ICD-10-CM

## 2023-03-08 DIAGNOSIS — G8929 Other chronic pain: Secondary | ICD-10-CM

## 2023-03-08 DIAGNOSIS — F209 Schizophrenia, unspecified: Secondary | ICD-10-CM

## 2023-03-08 DIAGNOSIS — Z79899 Other long term (current) drug therapy: Secondary | ICD-10-CM

## 2023-03-08 DIAGNOSIS — E785 Hyperlipidemia, unspecified: Secondary | ICD-10-CM

## 2023-03-08 DIAGNOSIS — F39 Unspecified mood [affective] disorder: Secondary | ICD-10-CM | POA: Insufficient documentation

## 2023-03-08 DIAGNOSIS — M545 Low back pain, unspecified: Secondary | ICD-10-CM

## 2023-03-08 MED ORDER — AMLODIPINE BESYLATE 10 MG PO TABS: 10.0000 mg | ORAL_TABLET | Freq: Every day | ORAL | 3 refills | Status: AC

## 2023-03-08 MED ORDER — MELOXICAM 15 MG PO TABS: 15.0000 mg | ORAL_TABLET | Freq: Every day | ORAL | 0 refills | Status: DC | PRN

## 2023-03-08 MED ORDER — ATENOLOL 25 MG PO TABS: 25.0000 mg | ORAL_TABLET | Freq: Every day | ORAL | 1 refills | Status: DC

## 2023-03-08 NOTE — Assessment & Plan Note (Signed)
Trial of meloxicam.  

## 2023-03-08 NOTE — Assessment & Plan Note (Addendum)
Uncontrolled/exacerbation.  Continue lisinopril.  Adding amlodipine.  Follow-up in 1 month.

## 2023-03-08 NOTE — Assessment & Plan Note (Signed)
Lipid panel today to assess. 

## 2023-03-08 NOTE — Progress Notes (Signed)
Subjective:  Patient ID: Paul Kidd, male    DOB: October 04, 1976  Age: 47 y.o. MRN: [id number]  CC: Chief Complaint  Patient presents with   [place] been out atenolol, back pain w/ L leg pain, referral to psych for bipolar and schitzophrenia    HPI:  47 year old male with uncontrolled hypertension, reported schizophrenia and bipolar disorder, chronic low back pain presents to establish care.  Patient is currently seeing a psychiatrist at [facility].  He is currently on Risperdal, prazosin, amitriptyline, and hydroxyzine.  Patient states that he has been dissatisfied with the care particularly in relation to attempt to get disability.  He would like a referral to another psychiatrist.  Patient is currently seeing a counselor.  Patient's hypertension is uncontrolled.  His fiance endorses compliance with lisinopril 20 mg daily.  However, he was previously prescribed atenolol which she has not been taking.  His blood pressure is markedly uncontrolled here today.  Patient also reports chronic low back pain with radiation to the left lower extremity.  He has previously been seen by physical medicine and rehabilitation.  Was prescribed Lyrica.  This medication did not "agree" with him.  Denies any injury.  Recent x-rays were essentially unremarkable.  Patient Active Problem List   Diagnosis Date Noted   Schizophrenia ([place]) 03/08/2023   High risk medication use 03/08/2023   Mood disorder ([place]) 03/08/2023   Chronic midline low back pain without sciatica 07/26/2017   Hyperlipidemia 07/26/2017   Essential hypertension 07/05/2017    Social Hx   Social History   Socioeconomic History   Marital status: Unknown    Spouse name: Not on file   Number of children: Not on file   Years of education: Not on file   Highest education level: Not on file  Occupational History   Not on file  Tobacco Use   Smoking status: Never   Smokeless tobacco: Never  Vaping Use    Vaping Use: Never used  Substance and Sexual Activity   Alcohol use: No   Drug use: No   Sexual activity: Yes    Birth control/protection: None  Other Topics Concern   Not on file  Social History Narrative   Not on file   Social Determinants of Health   Financial Resource Strain: Not on file  Food Insecurity: Not on file  Transportation Needs: Not on file  Physical Activity: Not on file  Stress: Not on file  Social Connections: Not on file    Review of Systems Per HPI  Objective:  BP (!) 185/129   Pulse 91   Temp 97.6 F (36.4 C)   Ht 5\' 9"  (1.753 m)   Wt 255 lb (115.7 kg)   SpO2 100%   BMI 37.66 kg/m      03/08/2023   10:50 AM 11/30/2022    1:12 PM 11/30/2022    1:06 PM  BP/Weight  Systolic BP 123XX123 [id number]  Diastolic BP Q000111Q Q000111Q Q000111Q  Wt. (Lbs) 255  259.2  BMI 37.66 kg/m2  38.28 kg/m2    Physical Exam Vitals and nursing note reviewed.  Constitutional:      Appearance: Normal appearance. He is obese.  HENT:     Head: Normocephalic and atraumatic.  Eyes:     General:        Right eye: No discharge.        Left eye: No discharge.     Conjunctiva/sclera: Conjunctivae normal.  Cardiovascular:  Rate and Rhythm: Normal rate and regular rhythm.  Pulmonary:     Effort: Pulmonary effort is normal.     Breath sounds: No wheezing, rhonchi or rales.  Neurological:     Mental Status: He is alert.  Psychiatric:        Behavior: Behavior normal.     Lab Results  Component Value Date   WBC 7.2 12/02/2020   HGB 14.1 12/02/2020   HCT 41.3 12/02/2020   PLT 273 12/02/2020   GLUCOSE 96 12/02/2020   CHOL 218 (H) 12/02/2020   TRIG 149 12/02/2020   HDL 48 12/02/2020   LDLCALC 143 (H) 12/02/2020   ALT 10 12/02/2020   AST 19 12/02/2020   NA 141 12/02/2020   K 4.0 12/02/2020   CL 100 12/02/2020   CREATININE 1.21 12/02/2020   BUN 13 12/02/2020   CO2 25 12/02/2020   HGBA1C 5.3 07/07/2017     Assessment & Plan:   Problem List Items Addressed This Visit        Cardiovascular and Mediastinum   Essential hypertension - Primary    Uncontrolled/exacerbation.  Continue lisinopril.  Adding amlodipine.  Follow-up in 1 month.      Relevant Medications   amLODipine (NORVASC) 10 MG tablet   Other Relevant Orders   CMP14+EGFR     Other   Chronic midline low back pain without sciatica    Trial of meloxicam.      Relevant Medications   meloxicam (MOBIC) 15 MG tablet   High risk medication use   Relevant Orders   CBC   Hemoglobin A1c   Hyperlipidemia    Lipid panel today to assess.      Relevant Medications   amLODipine (NORVASC) 10 MG tablet   Other Relevant Orders   Lipid panel   Mood disorder ([place])    Continue current medications.  Referring to psychiatry.      Relevant Orders   Ambulatory referral to Psychiatry   Schizophrenia [facility])    Continue current medications.  Referring to psychiatry.      Relevant Orders   Ambulatory referral to Psychiatry    Meds ordered this encounter  Medications   DISCONTD: atenolol (TENORMIN) 25 MG tablet    Sig: Take 1 tablet (25 mg total) by mouth daily. (NEEDS TO BE SEEN BEFORE NEXT REFILL)    Dispense:  90 tablet    Refill:  1   meloxicam (MOBIC) 15 MG tablet    Sig: Take 1 tablet (15 mg total) by mouth daily as needed for pain.    Dispense:  30 tablet    Refill:  0   amLODipine (NORVASC) 10 MG tablet    Sig: Take 1 tablet (10 mg total) by mouth daily.    Dispense:  90 tablet    Refill:  3    Follow-up:  Return in about 1 month (around 04/08/2023) for HTN follow up.  [place]

## 2023-03-08 NOTE — Assessment & Plan Note (Signed)
Continue current medications.  Referring to psychiatry.

## 2023-03-08 NOTE — Patient Instructions (Signed)
Medication as prescribed.  Referral is in to psychiatry.   Labs today.

## 2023-03-09 LAB — LIPID PANEL
Chol/HDL Ratio: 4.2 ratio (ref 0.0–5.0)
Cholesterol, Total: 214 mg/dL — ABNORMAL HIGH (ref 100–199)
HDL: 51 mg/dL (ref >39)
LDL Chol Calc (NIH): 138 mg/dL — ABNORMAL HIGH (ref 0–99)
Triglycerides: 142 mg/dL (ref 0–149)
VLDL Cholesterol Cal: 25 mg/dL (ref 5–40)

## 2023-03-09 LAB — HEMOGLOBIN A1C
Est. average glucose Bld gHb Est-mCnc: 111 mg/dL
Hgb A1c MFr Bld: 5.5 % (ref 4.8–5.6)

## 2023-03-09 LAB — CMP14+EGFR
ALT: 8 IU/L (ref 0–44)
AST: 15 IU/L (ref 0–40)
Albumin/Globulin Ratio: 1.8 (ref 1.2–2.2)
Albumin: 5 g/dL (ref 4.1–5.1)
Alkaline Phosphatase: 69 IU/L (ref 44–121)
BUN/Creatinine Ratio: 11 (ref 9–20)
BUN: 14 mg/dL (ref 6–24)
Bilirubin Total: 1.3 mg/dL — ABNORMAL HIGH (ref 0.0–1.2)
CO2: 24 mmol/L (ref 20–29)
Calcium: 10.1 mg/dL (ref 8.7–10.2)
Chloride: 100 mmol/L (ref 96–106)
Creatinine, Ser: 1.25 mg/dL (ref 0.76–1.27)
Globulin, Total: 2.8 g/dL (ref 1.5–4.5)
Glucose: 122 mg/dL — ABNORMAL HIGH (ref 70–99)
Potassium: 4 mmol/L (ref 3.5–5.2)
Sodium: 141 mmol/L (ref 134–144)
Total Protein: 7.8 g/dL (ref 6.0–8.5)
eGFR: 73 mL/min/{1.73_m2} (ref >59)

## 2023-03-09 LAB — CBC
Hematocrit: 42.4 % (ref 37.5–51.0)
Hemoglobin: 13.9 g/dL (ref 13.0–17.7)
MCH: 28.7 pg (ref 26.6–33.0)
MCHC: 32.8 g/dL (ref 31.5–35.7)
MCV: 88 fL (ref 79–97)
Platelets: 255 10*3/uL (ref 150–450)
RBC: 4.84 x10E6/uL (ref 4.14–5.80)
RDW: 11.9 % (ref 11.6–15.4)
WBC: 7.4 10*3/uL (ref 3.4–10.8)

## 2023-03-10 ENCOUNTER — Other Ambulatory Visit: Payer: Self-pay | Admitting: Family Medicine

## 2023-03-10 MED ORDER — ROSUVASTATIN CALCIUM 20 MG PO TABS: 20.0000 mg | ORAL_TABLET | Freq: Every day | ORAL | 3 refills | Status: AC

## 2023-04-07 ENCOUNTER — Ambulatory Visit: Payer: Medicaid Other | Admitting: Family Medicine

## 2024-04-23 ENCOUNTER — Other Ambulatory Visit: Payer: Self-pay | Admitting: Family Medicine

## 2024-11-14 ENCOUNTER — Ambulatory Visit: Payer: MEDICAID | Admitting: Family Medicine

## 2024-11-27 ENCOUNTER — Ambulatory Visit: Payer: MEDICAID | Admitting: Family Medicine

## 2024-12-05 ENCOUNTER — Ambulatory Visit: Payer: MEDICAID | Admitting: Family Medicine

## 2024-12-05 VITALS — BP 204/139 | HR 89 | Temp 98.1°F | Ht 69.0 in | Wt 259.0 lb

## 2024-12-05 DIAGNOSIS — I10 Essential (primary) hypertension: Secondary | ICD-10-CM

## 2024-12-05 DIAGNOSIS — R39198 Other difficulties with micturition: Secondary | ICD-10-CM

## 2024-12-05 DIAGNOSIS — R300 Dysuria: Secondary | ICD-10-CM | POA: Diagnosis not present

## 2024-12-05 DIAGNOSIS — L918 Other hypertrophic disorders of the skin: Secondary | ICD-10-CM | POA: Insufficient documentation

## 2024-12-05 LAB — POCT URINALYSIS DIP (CLINITEK)
Bilirubin, UA: NEGATIVE
Glucose, UA: NEGATIVE mg/dL
Ketones, POC UA: NEGATIVE mg/dL
Leukocytes, UA: NEGATIVE
Nitrite, UA: NEGATIVE
POC PROTEIN,UA: 30 — AB
Spec Grav, UA: 1.015 (ref 1.010–1.025)
Urobilinogen, UA: 0.2 U/dL
pH, UA: 6.5 (ref 5.0–8.0)

## 2024-12-05 MED ORDER — TAMSULOSIN HCL 0.4 MG PO CAPS: 0.4000 mg | ORAL_CAPSULE | Freq: Every day | ORAL | 3 refills | Status: AC

## 2024-12-05 NOTE — Patient Instructions (Signed)
 Medication as prescribed.  Referral placed.  Follow up in 1 month for Hypertension.

## 2024-12-05 NOTE — Assessment & Plan Note (Signed)
Removed today. See procedure note

## 2024-12-05 NOTE — Progress Notes (Signed)
 Subjective:  Patient ID: Paul Kidd, male    DOB: 1976/06/21  Age: 48 y.o. MRN: [id number]  CC:   Chief Complaint  Patient presents with   skin tag on right side bottom   urinary problem    Describes waits about 2 to 3 mins before being able to urinate at times also some dysuria in the morning     HPI:  48 year old male presents for evaluation of the above.  Patient's blood pressure is markedly elevated today today.  Per patient and his significant other his blood pressures are well-controlled at home, typically in the 120s over 80s.  Chart reflects that he is prescribed medications for his mood and for hypertension.  EMR reflects that these medications have not been recently filled.  Advised patient that he should bring his medications in so that we can review them.  Patient reports that he is having some discomfort associated with a skin tag which is just below his right buttock.  He would like this to be removed.  Patient reports a longstanding history of intermittent dysuria and difficulty urinating/difficulty getting his stream started.  Per his report this has been going on since 2016.  His significant other states that he has previously had issues with his prostate.  He would like me to examine his prostate today.  Patient Active Problem List   Diagnosis Date Noted   Difficulty urinating 12/05/2024   Skin tag 12/05/2024   Schizophrenia ([facility]) 03/08/2023   High risk medication use 03/08/2023   Mood disorder 03/08/2023   Chronic midline low back pain without sciatica 07/26/2017   Hyperlipidemia 07/26/2017   Essential hypertension 07/05/2017    Social Hx   Social History   Socioeconomic History   Marital status: Unknown    Spouse name: Not on file   Number of children: Not on file   Years of education: Not on file   Highest education level: 12th grade  Occupational History   Not on file  Tobacco Use   Smoking status: Never   Smokeless tobacco: Never  Vaping Use    Vaping status: Never Used  Substance and Sexual Activity   Alcohol use: No   Drug use: No   Sexual activity: Yes    Birth control/protection: None  Other Topics Concern   Not on file  Social History Narrative   Not on file   Social Drivers of Health   Tobacco Use: Low Risk (11/09/2024)   Patient History    Smoking Tobacco Use: Never    Smokeless Tobacco Use: Never    Passive Exposure: Not on file  Financial Resource Strain: Medium Risk (11/09/2024)   Overall Financial Resource Strain (CARDIA)    Difficulty of Paying Living Expenses: Somewhat hard  Food Insecurity: Food Insecurity Present (11/09/2024)   Epic    Worried About Programme Researcher, Broadcasting/film/video in the Last Year: Sometimes true    Ran Out of Food in the Last Year: Sometimes true  Transportation Needs: Unknown (11/09/2024)   Epic    Lack of Transportation (Medical): No    Lack of Transportation (Non-Medical): Patient declined  Physical Activity: Insufficiently Active (11/09/2024)   Exercise Vital Sign    Days of Exercise per Week: 3 days    Minutes of Exercise per Session: 30 min  Stress: Stress Concern Present (11/09/2024)   [employer] of Occupational Health - Occupational Stress Questionnaire    Feeling of Stress: Very much  Social Connections: Socially Isolated (11/09/2024)   Social Connection  and Isolation Panel    Frequency of Communication with Friends and Family: Never    Frequency of Social Gatherings with Friends and Family: Never    Attends Religious Services: Patient declined    Active Member of Clubs or Organizations: No    Attends Engineer, Structural: Not on file    Marital Status: Living with partner  Depression (PHQ2-9): High Risk (12/05/2024)   Depression (PHQ2-9)    PHQ-2 Score: 26  Alcohol Screen: Not on file  Housing: Unknown (11/09/2024)   Epic    Unable to Pay for Housing in the Last Year: Patient declined    Number of Times Moved in the Last Year: Not on file    Homeless in  the Last Year: No  Utilities: Not on file  Health Literacy: Not on file    Review of Systems Per HPI  Objective:  BP (!) 204/139   Pulse 89   Temp 98.1 F (36.7 C)   Ht 5' 9 (1.753 m)   Wt 259 lb (117.5 kg)   SpO2 98%   BMI 38.25 kg/m      12/05/2024   11:01 AM 03/08/2023   10:50 AM 11/30/2022    1:12 PM  BP/Weight  Systolic BP 204 185 180  Diastolic BP 139 129 133  Wt. (Lbs) 259 255   BMI 38.25 kg/m2 37.66 kg/m2     Physical Exam Vitals and nursing note reviewed.  Constitutional:      General: He is not in acute distress.    Appearance: He is obese.  HENT:     Head: Normocephalic and atraumatic.  Pulmonary:     Effort: Pulmonary effort is normal. No respiratory distress.  Genitourinary:    Comments: Normal exam of the prostate.  Not appear to be enlarged.  No nodules.  Skin tag noted just below the right buttock. Neurological:     Mental Status: He is alert.  Psychiatric:     Comments: Very anxious.     Lab Results  Component Value Date   WBC 7.4 03/08/2023   HGB 13.9 03/08/2023   HCT 42.4 03/08/2023   PLT 255 03/08/2023   GLUCOSE 122 (H) 03/08/2023   CHOL 214 (H) 03/08/2023   TRIG 142 03/08/2023   HDL 51 03/08/2023   LDLCALC 138 (H) 03/08/2023   ALT 8 03/08/2023   AST 15 03/08/2023   NA 141 03/08/2023   K 4.0 03/08/2023   CL 100 03/08/2023   CREATININE 1.25 03/08/2023   BUN 14 03/08/2023   CO2 24 03/08/2023   HGBA1C 5.5 03/08/2023   Procedure: Skin tag removal Area was cleansed with Betadine. Local anesthesia with 3 mL of 1% lidocaine with epinephrine. Stalk of skin tag was clamped and skin tag was subsequently removed with scissors. Minimal bleeding.  Bleeding was controlled with silver nitrate. Patient tolerated procedure without difficulty.  No complications.  Assessment & Plan:  Difficulty urinating Assessment & Plan: UA with no evidence of infection.  Referring to urology.  Empiric Flomax .  Orders: -     Tamsulosin  HCl; Take  1 capsule (0.4 mg total) by mouth daily.  Dispense: 30 capsule; Refill: 3 -     Ambulatory referral to Urology  Dysuria -     POCT URINALYSIS DIP (CLINITEK)  Essential hypertension Assessment & Plan: Uncontrolled although he states that his blood pressures are well-controlled at home.  Advised to follow-up in 1 month.  Advised him to bring his blood pressure medications and.  Skin tag Assessment & Plan: Removed today.  See procedure note.     Follow-up: 1 month  Vonya Ohalloran DO [facility]

## 2024-12-05 NOTE — Assessment & Plan Note (Signed)
 Uncontrolled although he states that his blood pressures are well-controlled at home.  Advised to follow-up in 1 month.  Advised him to bring his blood pressure medications and.

## 2024-12-05 NOTE — Assessment & Plan Note (Signed)
 UA with no evidence of infection.  Referring to urology.  Empiric Flomax .
